# Patient Record
Sex: Female | Born: 1985 | Hispanic: No | Marital: Married | Smoking: Never smoker
Health system: Southern US, Community
[De-identification: ages and names within clinical notes are randomized; demographics above are authoritative.]

## PROBLEM LIST (undated history)

## (undated) ENCOUNTER — Inpatient Hospital Stay (HOSPITAL_COMMUNITY): Payer: Self-pay

## (undated) DIAGNOSIS — Z789 Other specified health status: Secondary | ICD-10-CM

## (undated) DIAGNOSIS — K8020 Calculus of gallbladder without cholecystitis without obstruction: Secondary | ICD-10-CM

## (undated) HISTORY — DX: Calculus of gallbladder without cholecystitis without obstruction: K80.20

---

## 2012-03-10 ENCOUNTER — Other Ambulatory Visit (HOSPITAL_COMMUNITY): Payer: Self-pay | Admitting: Physician Assistant

## 2012-03-10 DIAGNOSIS — Z3689 Encounter for other specified antenatal screening: Secondary | ICD-10-CM

## 2012-03-10 LAB — OB RESULTS CONSOLE HEPATITIS B SURFACE ANTIGEN: Hepatitis B Surface Ag: NEGATIVE

## 2012-03-10 LAB — OB RESULTS CONSOLE RUBELLA ANTIBODY, IGM: Rubella: IMMUNE

## 2012-03-10 LAB — CYSTIC FIBROSIS DIAGNOSTIC STUDY: Interpretation-CFDNA:: NEGATIVE

## 2012-03-10 LAB — OB RESULTS CONSOLE ANTIBODY SCREEN: Antibody Screen: NEGATIVE

## 2012-03-10 LAB — OB RESULTS CONSOLE GC/CHLAMYDIA: Gonorrhea: NEGATIVE

## 2012-03-10 LAB — OB RESULTS CONSOLE ABO/RH

## 2012-03-10 LAB — GLUCOSE TOLERANCE, 1 HOUR (50G) W/O FASTING: Glucose, 1 Hour GTT: 111

## 2012-03-14 ENCOUNTER — Ambulatory Visit (HOSPITAL_COMMUNITY)
Admission: RE | Admit: 2012-03-14 | Discharge: 2012-03-14 | Disposition: A | Discharge status: Home / Self Care | Payer: Medicaid Other | Source: Ambulatory Visit | Attending: Physician Assistant | Admitting: Physician Assistant

## 2012-03-14 ENCOUNTER — Encounter (HOSPITAL_COMMUNITY): Payer: Self-pay

## 2012-03-14 DIAGNOSIS — Z1389 Encounter for screening for other disorder: Secondary | ICD-10-CM | POA: Insufficient documentation

## 2012-03-14 DIAGNOSIS — Z363 Encounter for antenatal screening for malformations: Secondary | ICD-10-CM | POA: Insufficient documentation

## 2012-03-14 DIAGNOSIS — Z3689 Encounter for other specified antenatal screening: Secondary | ICD-10-CM

## 2012-03-14 DIAGNOSIS — O358XX0 Maternal care for other (suspected) fetal abnormality and damage, not applicable or unspecified: Secondary | ICD-10-CM | POA: Insufficient documentation

## 2012-04-04 ENCOUNTER — Other Ambulatory Visit (HOSPITAL_COMMUNITY): Payer: Self-pay | Admitting: Nurse Practitioner

## 2012-04-04 DIAGNOSIS — Z0489 Encounter for examination and observation for other specified reasons: Secondary | ICD-10-CM

## 2012-04-10 ENCOUNTER — Ambulatory Visit (HOSPITAL_COMMUNITY): Payer: Medicaid Other

## 2012-04-11 ENCOUNTER — Ambulatory Visit (HOSPITAL_COMMUNITY)
Admission: RE | Admit: 2012-04-11 | Discharge: 2012-04-11 | Disposition: A | Discharge status: Home / Self Care | Payer: Medicaid Other | Source: Ambulatory Visit | Attending: Nurse Practitioner | Admitting: Nurse Practitioner

## 2012-04-11 DIAGNOSIS — Z1389 Encounter for screening for other disorder: Secondary | ICD-10-CM | POA: Insufficient documentation

## 2012-04-11 DIAGNOSIS — O358XX0 Maternal care for other (suspected) fetal abnormality and damage, not applicable or unspecified: Secondary | ICD-10-CM | POA: Insufficient documentation

## 2012-04-11 DIAGNOSIS — Z363 Encounter for antenatal screening for malformations: Secondary | ICD-10-CM | POA: Insufficient documentation

## 2012-04-11 DIAGNOSIS — Z0489 Encounter for examination and observation for other specified reasons: Secondary | ICD-10-CM

## 2012-05-23 LAB — OB RESULTS CONSOLE RPR: RPR: NONREACTIVE

## 2012-05-23 LAB — OB RESULTS CONSOLE HGB/HCT, BLOOD: Hemoglobin: 12 g/dL

## 2012-05-23 LAB — GLUCOSE TOLERANCE, 1 HOUR (50G) W/O FASTING: Glucose, 1 Hour GTT: 117

## 2012-06-08 ENCOUNTER — Inpatient Hospital Stay (HOSPITAL_COMMUNITY): Payer: Medicaid Other

## 2012-06-08 ENCOUNTER — Inpatient Hospital Stay (HOSPITAL_COMMUNITY)
Admission: AD | Admit: 2012-06-08 | Discharge: 2012-06-09 | Disposition: A | Discharge status: Home / Self Care | Payer: Medicaid Other | Source: Ambulatory Visit | Attending: Obstetrics & Gynecology | Admitting: Obstetrics & Gynecology

## 2012-06-08 ENCOUNTER — Encounter (HOSPITAL_COMMUNITY): Payer: Self-pay | Admitting: *Deleted

## 2012-06-08 DIAGNOSIS — R079 Chest pain, unspecified: Secondary | ICD-10-CM | POA: Insufficient documentation

## 2012-06-08 DIAGNOSIS — R1013 Epigastric pain: Secondary | ICD-10-CM | POA: Insufficient documentation

## 2012-06-08 DIAGNOSIS — K219 Gastro-esophageal reflux disease without esophagitis: Secondary | ICD-10-CM | POA: Insufficient documentation

## 2012-06-08 DIAGNOSIS — O99891 Other specified diseases and conditions complicating pregnancy: Secondary | ICD-10-CM | POA: Insufficient documentation

## 2012-06-08 HISTORY — DX: Other specified health status: Z78.9

## 2012-06-08 LAB — CARDIAC PANEL(CRET KIN+CKTOT+MB+TROPI)
CK, MB: 2 ng/mL (ref 0.3–4.0)
Relative Index: INVALID (ref 0.0–2.5)
Total CK: 37 U/L (ref 7–177)
Troponin I: <0.3 ng/mL (ref <0.30)

## 2012-06-08 LAB — CBC
HCT: 37.2 % (ref 36.0–46.0)
Hemoglobin: 12.5 g/dL (ref 12.0–15.0)
MCH: 28.3 pg (ref 26.0–34.0)
RBC: 4.41 MIL/uL (ref 3.87–5.11)

## 2012-06-08 LAB — COMPREHENSIVE METABOLIC PANEL
ALT: 80 U/L — ABNORMAL HIGH (ref 0–35)
AST: 76 U/L — ABNORMAL HIGH (ref 0–37)
CO2: 26 mEq/L (ref 19–32)
Calcium: 9.9 mg/dL (ref 8.4–10.5)
Creatinine, Ser: 0.42 mg/dL — ABNORMAL LOW (ref 0.50–1.10)
GFR calc non Af Amer: >90 mL/min (ref >90)
Sodium: 137 mEq/L (ref 135–145)
Total Protein: 7 g/dL (ref 6.0–8.3)

## 2012-06-08 LAB — URINALYSIS, ROUTINE W REFLEX MICROSCOPIC
Glucose, UA: NEGATIVE mg/dL
Leukocytes, UA: NEGATIVE
Protein, ur: NEGATIVE mg/dL
Specific Gravity, Urine: 1.025 (ref 1.005–1.030)
Urobilinogen, UA: 1 mg/dL (ref 0.0–1.0)

## 2012-06-08 MED ORDER — ASPIRIN 325 MG PO TABS: 325.0000 mg | ORAL_TABLET | Freq: Every day | ORAL | Status: DC

## 2012-06-08 MED ORDER — ASPIRIN 81 MG PO CHEW
324.0000 mg | CHEWABLE_TABLET | Freq: Once | ORAL | Status: AC
  Administered 2012-06-08: 324 mg via ORAL
  Filled 2012-06-08: qty 4

## 2012-06-08 MED ORDER — MORPHINE SULFATE 4 MG/ML IJ SOLN: 4.0000 mg | INTRAMUSCULAR | Status: DC | PRN

## 2012-06-08 MED ORDER — LACTATED RINGERS IV SOLN: INTRAVENOUS | Status: DC

## 2012-06-08 MED ORDER — LACTATED RINGERS IV BOLUS (SEPSIS)
500.0000 mL | Freq: Once | INTRAVENOUS | Status: AC
  Administered 2012-06-08: 500 mL via INTRAVENOUS

## 2012-06-08 MED ORDER — GI COCKTAIL ~~LOC~~
30.0000 mL | Freq: Once | ORAL | Status: AC
  Administered 2012-06-08: 30 mL via ORAL
  Filled 2012-06-08: qty 30

## 2012-06-08 MED ORDER — PANTOPRAZOLE SODIUM 40 MG IV SOLR
40.0000 mg | Freq: Once | INTRAVENOUS | Status: AC
  Administered 2012-06-08: 40 mg via INTRAVENOUS
  Filled 2012-06-08: qty 40

## 2012-06-08 NOTE — MAU Provider Note (Signed)
History    CSN: [id number] Arrival date and time: 06/08/12 2030  None  No chief complaint on file.  HPI Comments: Pt is a 25yo [place] [id number] @ [redacted]w[redacted]d who presents with CC of acute onset chest pain that is mid sternal radiating to midepigastrum.  Does not radiate down arm, does not radiate to neck or back.  Acute onset ~1 hour prior to evaluation in MAU.  Was transported via private vehicle.  Pain worse with exertion.  Worse with laying flat or with leaning forward; best when laying upright ~70o.  No diaphoresis.  Pain 10/10.    Baby is moving normally.  No bleeding, no discharge, no leaking of fluid.  No contractions noted.  Primary C-section for last delivery 2o/2 arrest of descent at fully dilated.  1st pregnancy with IUFD at 7 months per patient.    No prior history of GERD, last meal ~4 hours prior to onset.  Pt was cooking at the time of onset.   No LE edema, no LE pain, non-pleuritic pain   OB History    Grav Para Term Preterm Abortions TAB SAB Ect Mult Living   3 2 1 1      1       Past Medical History  Diagnosis Date  . No pertinent past medical history     Past Surgical History  Procedure Date  . Cesarean section     Family History  Problem Relation Age of Onset  . Other Neg Hx     History  Substance Use Topics  . Smoking status: Never Smoker   . Smokeless tobacco: Not on file  . Alcohol Use: No    Allergies: No Known Allergies  Prescriptions prior to admission  Medication Sig Dispense Refill  . Prenatal Vit-Fe Fumarate-FA (PRENATAL MULTIVITAMIN) TABS Take 1 tablet by mouth daily.        Review of Systems  Constitutional: Negative for fever and chills.  HENT: Negative for congestion.   Eyes: Negative for blurred vision and double vision.  Respiratory: Negative for cough and wheezing.   Cardiovascular: Positive for chest pain. Negative for palpitations, orthopnea, claudication and leg swelling.  Gastrointestinal: Negative for heartburn, nausea,  vomiting (earlier in pregnancy but not since 1st trimester), abdominal pain, diarrhea, constipation, blood in stool and melena.  Genitourinary: Negative for dysuria, urgency, frequency and hematuria.  Musculoskeletal: Negative.   Skin: Negative.   Neurological: Negative.  Negative for dizziness and headaches.  Endo/Heme/Allergies: Negative.   Psychiatric/Behavioral: Negative.    Physical Exam   Blood pressure 117/85, pulse 90, temperature 97.5 F (36.4 C), temperature source Oral, resp. rate 26, SpO2 100.00%.  Physical Exam  Nursing note and vitals reviewed. Constitutional: She appears well-developed and well-nourished. She appears distressed (severely uncomfortable).  HENT:  Head: Normocephalic and atraumatic.  Mouth/Throat: No oropharyngeal exudate.  Eyes: Conjunctivae are normal. Right eye exhibits no discharge. Left eye exhibits no discharge. No scleral icterus.  Neck: No JVD present.  Cardiovascular: Normal rate, regular rhythm and intact distal pulses.  Exam reveals no gallop and no friction rub.   Murmur (II/VI systolic flow murmur heard best at 2nd L sternal boarder) heard. Respiratory: Effort normal and breath sounds normal. No respiratory distress. She has no wheezes. She has no rales. She exhibits tenderness (worsening pain with midsternal palpation but constant pain at baseline).  GI: Soft. Bowel sounds are normal. She exhibits distension and mass. There is no tenderness. There is no rebound and no guarding.  Genitourinary: Vagina normal.  Dilation: Closed Effacement (%): Thick Exam by:: Legg   Musculoskeletal: She exhibits no edema and no tenderness.  Neurological: She is alert. She exhibits normal muscle tone.  Skin: Skin is warm and dry. No rash noted. She is not diaphoretic. No erythema. No pallor.  Psychiatric: She has a normal mood and affect. Her behavior is normal. Judgment and thought content normal.    MAU Course  Procedures Results for orders placed  during the hospital encounter of 06/08/12 (from the past 24 hour(s))  CARDIAC PANEL(CRET KIN+CKTOT+MB+TROPI)     Status: Normal   Collection Time   06/08/12  9:37 PM      Component Value Range   Total CK 37  7 - 177 U/L   CK, MB 2.0  0.3 - 4.0 ng/mL   Troponin I <0.30  <0.30 ng/mL   Relative Index RELATIVE INDEX IS INVALID  0.0 - 2.5  COMPREHENSIVE METABOLIC PANEL     Status: Abnormal   Collection Time   06/08/12  9:37 PM      Component Value Range   Sodium 137  135 - 145 mEq/L   Potassium 3.6  3.5 - 5.1 mEq/L   Chloride 100  96 - 112 mEq/L   CO2 26  19 - 32 mEq/L   Glucose, Bld 96  70 - 99 mg/dL   BUN 6  6 - 23 mg/dL   Creatinine, Ser 1.19 (*) 0.50 - 1.10 mg/dL   Calcium 9.9  8.4 - 14.7 mg/dL   Total Protein 7.0  6.0 - 8.3 g/dL   Albumin 3.1 (*) 3.5 - 5.2 g/dL   AST 76 (*) 0 - 37 U/L   ALT 80 (*) 0 - 35 U/L   Alkaline Phosphatase 136 (*) 39 - 117 U/L   Total Bilirubin 0.4  0.3 - 1.2 mg/dL   GFR calc non Af Amer >90  >90 mL/min   GFR calc Af Amer >90  >90 mL/min  CBC     Status: Abnormal   Collection Time   06/08/12  9:37 PM      Component Value Range   WBC 10.7 (*) 4.0 - 10.5 K/uL   RBC 4.41  3.87 - 5.11 MIL/uL   Hemoglobin 12.5  12.0 - 15.0 g/dL   HCT 82.9  56.2 - 13.0 %   MCV 84.4  78.0 - 100.0 fL   MCH 28.3  26.0 - 34.0 pg   MCHC 33.6  30.0 - 36.0 g/dL   RDW 86.5  78.4 - 69.6 %   Platelets 179  150 - 400 K/uL  PROTEIN / CREATININE RATIO, URINE     Status: Abnormal   Collection Time   06/08/12 10:15 PM      Component Value Range   Creatinine, Urine 81.16     Total Protein, Urine 21.2     PROTEIN CREATININE RATIO 0.26 (*) 0.00 - 0.15  URINALYSIS, ROUTINE W REFLEX MICROSCOPIC     Status: Normal   Collection Time   06/08/12 10:15 PM      Component Value Range   Color, Urine YELLOW  YELLOW   APPearance CLEAR  CLEAR   Specific Gravity, Urine 1.025  1.005 - 1.030   pH 6.5  5.0 - 8.0   Glucose, UA NEGATIVE  NEGATIVE mg/dL   Hgb urine dipstick NEGATIVE  NEGATIVE    Bilirubin Urine NEGATIVE  NEGATIVE   Ketones, ur NEGATIVE  NEGATIVE mg/dL   Protein, ur NEGATIVE  NEGATIVE mg/dL   Urobilinogen, UA  1.0  0.0 - 1.0 mg/dL   Nitrite NEGATIVE  NEGATIVE   Leukocytes, UA NEGATIVE  NEGATIVE    MDM EKG with Inverted P&T waves in V1 & V2.  No signs of ST elevation/depression.  No evidence of R heart strain.  S/p ASA 325, Cardiac Enzymes Negative X 1,  Normal physiologic variant in V1&V2 BP elevated to 140/86, likely due to pain.  Will check PIH labs. No evidence of LE DVT and O2 saturations 100%. S/p GI Cocktail - no improvement  Pt had spontaneous improvement ~73minutes following GI cocktail with resolution from 7/10 to 0/10 following a positional change. Transaminitis in setting of normal platelets,  BPs improved with resolution of pain, negative protein on dipstick, neg Pr:Cr ratio.  Consider Biliary Colic, No ultrasound evidence of acute cholecystitis  Given spontaneous resolution of pain cardiac source less likely.    Assessment and Plan  Epigastric Pain; GERD.  Improved with GI cocktail, will tx with PPI daily Transaminitis, no evidence of acute cholecystitis, consider passed stone as etiology  Pt evaluated by Dr. Penne Lash in MAU.  Andrena Mews, DO [facility] Resident - PGY-1 06/09/2012 12:49 AM

## 2012-06-08 NOTE — MAU Note (Signed)
Pt reports chest sudden chest pain at 2030

## 2012-06-09 LAB — PROTEIN / CREATININE RATIO, URINE
Creatinine, Urine: 81.16 mg/dL
Total Protein, Urine: 21.2 mg/dL

## 2012-06-09 LAB — LIPASE, BLOOD: Lipase: 42 U/L (ref 11–59)

## 2012-06-09 MED ORDER — OMEPRAZOLE 40 MG PO CPDR: 40.0000 mg | DELAYED_RELEASE_CAPSULE | Freq: Every day | ORAL | Status: DC

## 2012-06-09 NOTE — Discharge Instructions (Signed)
Heartburn During Pregnancy  Heartburn is a burning sensation in the chest caused by stomach acid backing up into the esophagus. Heartburn (also known as "reflux") is common in pregnancy because a certain hormone (progesterone) changes. The progesterone hormone may relax the valve that separates the esophagus from the stomach. This allows acid to go up into the esophagus, causing heartburn. Heartburn may also happen in pregnancy because the enlarging uterus pushes up on the stomach, which pushes more acid into the esophagus. This is especially true in the later stages of pregnancy. Heartburn problems usually go away after giving birth. CAUSES   The progesterone hormone.   Changing hormone levels.   The growing uterus that pushes stomach acid upward.   Large meals.   Certain foods and drinks.   Exercise.   Increased acid production.  SYMPTOMS   Burning pain in the chest or lower throat.   Bitter taste in the mouth.   Coughing.  DIAGNOSIS  Heartburn is typically diagnosed by your caregiver when taking a careful history of your concern. Your caregiver may order a blood test to check for a certain type of bacteria that is associated with heartburn. Sometimes, heartburn is diagnosed by prescribing a heartburn medicine to see if the symptoms improve. It is rare in pregnancy to have a procedure called an endoscopy. This is when a tube with a light and a camera on the end is used to examine the esophagus and the stomach. TREATMENT   Your caregiver may tell you to use certain over-the-counter medicines (antacids, acid reducers) for mild heartburn.   Your caregiver may prescribe medicines to decrease stomach acid or to protect your stomach lining.   Your caregiver may recommend certain diet changes.   For severe cases, your caregiver may recommend that the head of the bed be elevated on blocks. (Sleeping with more pillows is not an effective treatment as it only changes the position of your  head and does not improve the main problem of stomach acid refluxing into the esophagus.)  HOME CARE INSTRUCTIONS   Take all medicines as directed by your caregiver.   Raise the head of your bed by putting blocks under the legs if instructed to by your caregiver.   Do not exercise right after eating.   Avoid eating 2 or 3 hours before bed. Do not lie down right after eating.   Eat small meals throughout the day instead of 3 large meals.   Identify foods and beverages that make your symptoms worse and avoid them. Foods you may want to avoid include:   Peppers.   Chocolate.   High-fat foods, including fried foods.   Spicy foods.   Garlic and onions.   Citrus fruits, including oranges, grapefruit, lemons, and limes.   Food containing tomatoes or tomato products.   Mint.   Carbonated and caffeinated drinks.   Vinegar.  SEEK IMMEDIATE MEDICAL CARE IF:   You have severe chest pain that goes down your arm or into your jaw or neck.   You feel sweaty, dizzy, or lightheaded.   You become short of breath.   You vomit blood.   You have difficulty or pain with swallowing.   You have bloody or black, tarry stools.   You have episodes of heartburn more than 3 times a week, for more than 2 weeks.  MAKE SURE YOU:  Understand these instructions.   Will watch your condition.   Will get help right away if you are not doing well or   get worse.  Document Released: 11/30/2000 Document Revised: 11/22/2011 Document Reviewed: 05/24/2011 [employer] Patient Information 2012 [employer]. 

## 2012-06-13 ENCOUNTER — Encounter (HOSPITAL_COMMUNITY): Payer: Self-pay | Admitting: *Deleted

## 2012-06-13 ENCOUNTER — Other Ambulatory Visit: Payer: Self-pay | Admitting: Obstetrics & Gynecology

## 2012-06-13 ENCOUNTER — Observation Stay (HOSPITAL_COMMUNITY)
Admission: AD | Admit: 2012-06-13 | Discharge: 2012-06-15 | DRG: 781 | Disposition: A | Discharge status: Home / Self Care | Payer: Medicaid Other | Source: Ambulatory Visit | Attending: Obstetrics & Gynecology | Admitting: Obstetrics & Gynecology

## 2012-06-13 DIAGNOSIS — K219 Gastro-esophageal reflux disease without esophagitis: Secondary | ICD-10-CM | POA: Diagnosis present

## 2012-06-13 DIAGNOSIS — O26613 Liver and biliary tract disorders in pregnancy, third trimester: Secondary | ICD-10-CM

## 2012-06-13 DIAGNOSIS — R7989 Other specified abnormal findings of blood chemistry: Secondary | ICD-10-CM | POA: Diagnosis present

## 2012-06-13 DIAGNOSIS — R748 Abnormal levels of other serum enzymes: Secondary | ICD-10-CM

## 2012-06-13 DIAGNOSIS — O9989 Other specified diseases and conditions complicating pregnancy, childbirth and the puerperium: Secondary | ICD-10-CM

## 2012-06-13 DIAGNOSIS — O34219 Maternal care for unspecified type scar from previous cesarean delivery: Secondary | ICD-10-CM | POA: Diagnosis present

## 2012-06-13 DIAGNOSIS — O10019 Pre-existing essential hypertension complicating pregnancy, unspecified trimester: Secondary | ICD-10-CM

## 2012-06-13 DIAGNOSIS — O99891 Other specified diseases and conditions complicating pregnancy: Principal | ICD-10-CM | POA: Diagnosis present

## 2012-06-13 DIAGNOSIS — O09299 Supervision of pregnancy with other poor reproductive or obstetric history, unspecified trimester: Secondary | ICD-10-CM

## 2012-06-13 LAB — CBC
MCHC: 33.1 g/dL (ref 30.0–36.0)
Platelets: 193 10*3/uL (ref 150–400)
RDW: 14.7 % (ref 11.5–15.5)
WBC: 10 10*3/uL (ref 4.0–10.5)

## 2012-06-13 LAB — COMPREHENSIVE METABOLIC PANEL
BUN: 5 mg/dL — ABNORMAL LOW (ref 6–23)
CO2: 26 mEq/L (ref 19–32)
Calcium: 9.3 mg/dL (ref 8.4–10.5)
Chloride: 101 mEq/L (ref 96–112)
Creatinine, Ser: 0.44 mg/dL — ABNORMAL LOW (ref 0.50–1.10)
GFR calc Af Amer: >90 mL/min (ref >90)
GFR calc non Af Amer: >90 mL/min (ref >90)
Glucose, Bld: 83 mg/dL (ref 70–99)
Total Bilirubin: 0.2 mg/dL — ABNORMAL LOW (ref 0.3–1.2)

## 2012-06-13 LAB — PROTEIN / CREATININE RATIO, URINE: Total Protein, Urine: <4 mg/dL

## 2012-06-13 MED ORDER — PANTOPRAZOLE SODIUM 40 MG PO TBEC
80.0000 mg | DELAYED_RELEASE_TABLET | Freq: Every day | ORAL | Status: DC
  Administered 2012-06-13 – 2012-06-14 (×2): 80 mg via ORAL
  Filled 2012-06-13 (×4): qty 2

## 2012-06-13 MED ORDER — PRENATAL MULTIVITAMIN CH
1.0000 | ORAL_TABLET | Freq: Every day | ORAL | Status: DC
  Administered 2012-06-13 – 2012-06-14 (×2): 1 via ORAL
  Filled 2012-06-13 (×2): qty 1

## 2012-06-13 MED ORDER — ZOLPIDEM TARTRATE 5 MG PO TABS: 5.0000 mg | ORAL_TABLET | Freq: Every evening | ORAL | Status: DC | PRN

## 2012-06-13 MED ORDER — PRENATAL MULTIVITAMIN CH: 1.0000 | ORAL_TABLET | Freq: Every day | ORAL | Status: DC

## 2012-06-13 MED ORDER — CALCIUM CARBONATE ANTACID 500 MG PO CHEW: 2.0000 | CHEWABLE_TABLET | ORAL | Status: DC | PRN

## 2012-06-13 MED ORDER — DOCUSATE SODIUM 100 MG PO CAPS
100.0000 mg | ORAL_CAPSULE | Freq: Every day | ORAL | Status: DC
  Administered 2012-06-14: 100 mg via ORAL
  Filled 2012-06-13: qty 1

## 2012-06-13 MED ORDER — ACETAMINOPHEN 325 MG PO TABS: 650.0000 mg | ORAL_TABLET | ORAL | Status: DC | PRN

## 2012-06-13 NOTE — MAU Note (Signed)
Patient states she received a call telling her blood work done on 6-23 was abnormal and to return to MAU for evaluation. Patient denies any pain, bleeding or leaking and reports good fetal movement.

## 2012-06-13 NOTE — H&P (Signed)
Kayla Mclean is a  25 y.o. G3P1101 at [redacted]w[redacted]d who presents for reevaluation of hypertension labs. Patient was seen in the MAU on 06/08/12 for epigastric pain. Patient was worked up for acute coronary syndrome which was found to be negative (no ST changes on EKG, CE x 1 neg). Patient was given GI cocktail which completely resolved symptoms and she was discharged home on Prilosec OTC daily which has continued to work effectively. Urinary protein/creatinine ratio lab work was resulted soon after visit revealing a ratio of 0.26. Patient also had increased BP (140/86) and increased AST/ALT (76 / 80) and thus she was asked to return today for further workup and evaluation.   Patient denies any complaints at this time and simply returns to MAU per phone call from health dept. asking patient to present to MAU.  Patient states no contractions associated with no vaginal bleeding or discharge, membranes are intact, along with active fetal movements.  Obstetrical/Gynecological History: OB History    Grav Para Term Preterm Abortions TAB SAB Ect Mult Living   3 2 1 1 0  0   1      Past Medical History: Past Medical History  Diagnosis Date  . No pertinent past medical history     Past Surgical History: Past Surgical History  Procedure Date  . Cesarean section     Family History: Family History  Problem Relation Age of Onset  . Other Neg Hx     Social History: History  Substance Use Topics  . Smoking status: Never Smoker   . Smokeless tobacco: Never Used  . Alcohol Use: No    Allergies: No Known Allergies  Prescriptions prior to admission  Medication Sig Dispense Refill  . omeprazole (PRILOSEC) 40 MG capsule Take 1 capsule (40 mg total) by mouth daily.  30 capsule  0  . Prenatal Vit-Fe Fumarate-FA (PRENATAL MULTIVITAMIN) TABS Take 1 tablet by mouth daily.        Review of Systems - General ROS: negative for - chills, fever or night sweats Psychological ROS: negative for - anxiety or  depression Ophthalmic ROS: negative ENT ROS: negative for - headaches, hearing change or visual changes Allergy and Immunology ROS: negative Hematological and Lymphatic ROS: negative for - bleeding problems or night sweats Endocrine ROS: negative for - malaise/lethargy Respiratory ROS: no cough, shortness of breath, or wheezing Cardiovascular ROS: no chest pain or dyspnea on exertion Gastrointestinal ROS: no abdominal pain, change in bowel habits, or black or bloody stools Genito-Urinary ROS: no dysuria, trouble voiding, or hematuria Musculoskeletal ROS: negative Neurological ROS: no TIA or stroke symptoms Dermatological ROS: negative  Physical Exam   Blood pressure 111/76, pulse 94, temperature 98.3 F (36.8 C), temperature source Oral, resp. rate 16, height 4' 9.5" (1.461 m), weight 64.048 kg (141 lb 3.2 oz), SpO2 100.00%.  General: General appearance - alert, well appearing, and in no distress Mental status - alert, oriented to person, place, and time Eyes - pupils equal and reactive, extraocular eye movements intact; no yellowing of eyes Mouth - mucous membranes moist, pharynx normal without lesions Neck - supple, no significant adenopathy Chest - clear to auscultation, no wheezes, rales or rhonchi, symmetric air entry Heart - normal rate, regular rhythm, normal S1, S2, no murmurs, rubs, clicks or gallops Abdomen - soft, nontender, nondistended, no masses or organomegaly Back exam - full range of motion, no tenderness, palpable spasm or pain on motion Neurological - alert, oriented, normal speech, no focal findings or movement disorder noted   Musculoskeletal - no joint tenderness, deformity or swelling Extremities - pedal edema 2 + RLE, 1+ LLE Skin - normal coloration and turgor, no rashes, no suspicious skin lesions noted Baseline: 140 bpm, Variability: Good {> 6 bpm), Accelerations: Reactive and Decelerations: Absent Contractions: None   Labs: Recent Results (from the past  24 hour(s))  CBC   Collection Time   06/13/12  3:55 PM      Component Value Range   WBC 10.0  4.0 - 10.5 K/uL   RBC 4.32  3.87 - 5.11 MIL/uL   Hemoglobin 12.1  12.0 - 15.0 g/dL   HCT 36.6  36.0 - 46.0 %   MCV 84.7  78.0 - 100.0 fL   MCH 28.0  26.0 - 34.0 pg   MCHC 33.1  30.0 - 36.0 g/dL   RDW 14.7  11.5 - 15.5 %   Platelets 193  150 - 400 K/uL  COMPREHENSIVE METABOLIC PANEL   Collection Time   06/13/12  3:55 PM      Component Value Range   Sodium 136  135 - 145 mEq/L   Potassium 3.5  3.5 - 5.1 mEq/L   Chloride 101  96 - 112 mEq/L   CO2 26  19 - 32 mEq/L   Glucose, Bld 83  70 - 99 mg/dL   BUN 5 (*) 6 - 23 mg/dL   Creatinine, Ser 0.44 (*) 0.50 - 1.10 mg/dL   Calcium 9.3  8.4 - 10.5 mg/dL   Total Protein 7.1  6.0 - 8.3 g/dL   Albumin 3.0 (*) 3.5 - 5.2 g/dL   AST 129 (*) 0 - 37 U/L   ALT 107 (*) 0 - 35 U/L   Alkaline Phosphatase 146 (*) 39 - 117 U/L   Total Bilirubin 0.2 (*) 0.3 - 1.2 mg/dL   GFR calc non Af Amer >90  >90 mL/min   GFR calc Af Amer >90  >90 mL/min   Imaging Studies:  Us Abdomen Limited Ruq  06/08/2012  *RADIOLOGY REPORT*  Clinical Data:  Epigastric pain.  Elevated liver function tests.  LIMITED ABDOMINAL ULTRASOUND - RIGHT UPPER QUADRANT  Comparison:  None.  Findings:  Gallbladder:  The gallbladder is not abnormally distended.  No gallbladder wall thickening, pericholecystic edema, stones, or sludge.  Common bile duct:  Normal caliber with measured diameter of 4 mm.  Liver:  Normal homogeneous parenchymal echotexture.  No focal mass lesions.  IMPRESSION: Normal ultrasound appearance of the liver, gallbladder, and common bile duct.                   Original Report Authenticated By: WILLIAM R. STEVENS, M.D.     Assessment: Kayla Mclean is  25 y.o. G3P1101 at [redacted]w[redacted]d presents for re-evaluation of hypertension labs. 1) Increased transaminases (AST, 129 / ALT,107) 2) BP improved today (111 / 76) 3) GERD 2/2 pregnancy   Plan: Admit to antenatal Hepatitis  Panel 24 hour urine collection Continue Prilosec for GERD  MUHAMMAD,Rayhana Slider  

## 2012-06-13 NOTE — Progress Notes (Signed)
[place] Kestner is a  26 y.o. G3P1101 at [redacted]w[redacted]d who presents for reevaluation of hypertension labs. Patient was seen in the MAU on 06/08/12 for epigastric pain. Patient was worked up for acute coronary syndrome which was found to be negative (no ST changes on EKG, CE x 1 neg). Patient was given GI cocktail which completely resolved symptoms and she was discharged home on Prilosec OTC daily which has continued to work effectively. Urinary protein/creatinine ratio lab work was resulted soon after visit revealing a ratio of 0.26. Patient also had increased BP (140/86) and increased AST/ALT (76 / 80) and thus she was asked to return today for further workup and evaluation.   Patient denies any complaints at this time and simply returns to MAU per phone call from health dept. asking patient to present to MAU.  Patient states no contractions associated with no vaginal bleeding or discharge, membranes are intact, along with active fetal movements.  Obstetrical/Gynecological History: OB History    Grav Para Term Preterm Abortions TAB SAB Ect Mult Living   3 2 1 1  0  0   1      Past Medical History: Past Medical History  Diagnosis Date  . No pertinent past medical history     Past Surgical History: Past Surgical History  Procedure Date  . Cesarean section     Family History: Family History  Problem Relation Age of Onset  . Other Neg Hx     Social History: History  Substance Use Topics  . Smoking status: Never Smoker   . Smokeless tobacco: Never Used  . Alcohol Use: No    Allergies: No Known Allergies  Prescriptions prior to admission  Medication Sig Dispense Refill  . omeprazole (PRILOSEC) 40 MG capsule Take 1 capsule (40 mg total) by mouth daily.  30 capsule  0  . Prenatal Vit-Fe Fumarate-FA (PRENATAL MULTIVITAMIN) TABS Take 1 tablet by mouth daily.        Review of Systems - General ROS: negative for - chills, fever or night sweats Psychological ROS: negative for - anxiety or  depression Ophthalmic ROS: negative ENT ROS: negative for - headaches, hearing change or visual changes Allergy and Immunology ROS: negative Hematological and Lymphatic ROS: negative for - bleeding problems or night sweats Endocrine ROS: negative for - malaise/lethargy Respiratory ROS: no cough, shortness of breath, or wheezing Cardiovascular ROS: no chest pain or dyspnea on exertion Gastrointestinal ROS: no abdominal pain, change in bowel habits, or black or bloody stools Genito-Urinary ROS: no dysuria, trouble voiding, or hematuria Musculoskeletal ROS: negative Neurological ROS: no TIA or stroke symptoms Dermatological ROS: negative  Physical Exam   Blood pressure 111/76, pulse 94, temperature 98.3 F (36.8 C), temperature source Oral, resp. rate 16, height 4' 9.5" (1.461 m), weight 64.048 kg (141 lb 3.2 oz), SpO2 100.00%.  General: General appearance - alert, well appearing, and in no distress Mental status - alert, oriented to person, place, and time Eyes - pupils equal and reactive, extraocular eye movements intact; no yellowing of eyes Mouth - mucous membranes moist, pharynx normal without lesions Neck - supple, no significant adenopathy Chest - clear to auscultation, no wheezes, rales or rhonchi, symmetric air entry Heart - normal rate, regular rhythm, normal S1, S2, no murmurs, rubs, clicks or gallops Abdomen - soft, nontender, nondistended, no masses or organomegaly Back exam - full range of motion, no tenderness, palpable spasm or pain on motion Neurological - alert, oriented, normal speech, no focal findings or movement disorder noted  Musculoskeletal - no joint tenderness, deformity or swelling Extremities - pedal edema 2 + RLE, 1+ LLE Skin - normal coloration and turgor, no rashes, no suspicious skin lesions noted Baseline: 140 bpm, Variability: Good {> 6 bpm), Accelerations: Reactive and Decelerations: Absent Contractions: None   Labs: Recent Results (from the past  24 hour(s))  CBC   Collection Time   06/13/12  3:55 PM      Component Value Range   WBC 10.0  4.0 - 10.5 K/uL   RBC 4.32  3.87 - 5.11 MIL/uL   Hemoglobin 12.1  12.0 - 15.0 g/dL   HCT 13.0  86.5 - 78.4 %   MCV 84.7  78.0 - 100.0 fL   MCH 28.0  26.0 - 34.0 pg   MCHC 33.1  30.0 - 36.0 g/dL   RDW 69.6  29.5 - 28.4 %   Platelets 193  150 - 400 K/uL  COMPREHENSIVE METABOLIC PANEL   Collection Time   06/13/12  3:55 PM      Component Value Range   Sodium 136  135 - 145 mEq/L   Potassium 3.5  3.5 - 5.1 mEq/L   Chloride 101  96 - 112 mEq/L   CO2 26  19 - 32 mEq/L   Glucose, Bld 83  70 - 99 mg/dL   BUN 5 (*) 6 - 23 mg/dL   Creatinine, Ser 1.32 (*) 0.50 - 1.10 mg/dL   Calcium 9.3  8.4 - 44.0 mg/dL   Total Protein 7.1  6.0 - 8.3 g/dL   Albumin 3.0 (*) 3.5 - 5.2 g/dL   AST 102 (*) 0 - 37 U/L   ALT 107 (*) 0 - 35 U/L   Alkaline Phosphatase 146 (*) 39 - 117 U/L   Total Bilirubin 0.2 (*) 0.3 - 1.2 mg/dL   GFR calc non Af Amer >90  >90 mL/min   GFR calc Af Amer >90  >90 mL/min   Imaging Studies:  US Abdomen Limited Ruq  06/08/2012  *RADIOLOGY REPORT*  Clinical Data:  Epigastric pain.  Elevated liver function tests.  LIMITED ABDOMINAL ULTRASOUND - RIGHT UPPER QUADRANT  Comparison:  None.  Findings:  Gallbladder:  The gallbladder is not abnormally distended.  No gallbladder wall thickening, pericholecystic edema, stones, or sludge.  Common bile duct:  Normal caliber with measured diameter of 4 mm.  Liver:  Normal homogeneous parenchymal echotexture.  No focal mass lesions.  IMPRESSION: Normal ultrasound appearance of the liver, gallbladder, and common bile duct.                   Original Report Authenticated By: Marlon Pel, M.D.     Assessment: Katyra Tomassetti is  26 y.o. G3P1101 at [redacted]w[redacted]d presents for re-evaluation of hypertension labs. 1) Increased transaminases (AST, 129 / ALT,107) 2) BP improved today (111 / 76) 3) GERD 2/2 pregnancy   Plan: Admit to antenatal Hepatitis  Panel 24 hour urine collection Continue Prilosec for GERD  [facility]

## 2012-06-13 NOTE — Progress Notes (Signed)
Pt has elevated liver enzymes when she was seen in MAU earlier this week.  Chest pain resolved with GI cocktail.  After discharge, the protein / creatinine ratio returned elevated at .26.  Will have pt come back in for rpt labs, BP check, rpt prot / creat ration and 24 hour urine.  Nicholes Calamity from health dept is calling patient.

## 2012-06-14 ENCOUNTER — Inpatient Hospital Stay (HOSPITAL_COMMUNITY): Payer: Medicaid Other

## 2012-06-14 LAB — CREATININE CLEARANCE, URINE, 24 HOUR
Creatinine, Urine: 31.61 mg/dL
Creatinine: 0.44 mg/dL — ABNORMAL LOW (ref 0.50–1.10)

## 2012-06-14 LAB — PROTEIN, URINE, 24 HOUR
Collection Interval-UPROT: 24 hours
Protein, 24H Urine: 123 mg/d — ABNORMAL HIGH (ref 50–100)
Urine Total Volume-UPROT: 2450 mL

## 2012-06-14 LAB — TYPE AND SCREEN

## 2012-06-14 NOTE — Progress Notes (Signed)
FACULTY PRACTICE ANTEPARTUM(COMPREHENSIVE) NOTE  Kayla Mclean is a 26 y.o. G3P1101 at [redacted]w[redacted]d by early ultrasound who is admitted for abnormal liver function tests. And borderline Pr/Cr ration of 0.26 to complete 24 hr urine collection test and obtain hepatitis panel.  Communication thought to be limited but pt speaks adequate English   Fetal presentation is unsure. Length of Stay:  1  Days  Subjective: Pt denies headache or abd pain. She had abd discomfort 1 week ago, resolved Patient reports the fetal movement as active. Patient reports uterine contraction  activity as none. Patient reports  vaginal bleeding as none. Patient describes fluid per vagina as None.  Vitals:  Blood pressure 101/73, pulse 87, temperature 97.7 F (36.5 C), temperature source Oral, resp. rate 18, height 4' 9.5" (1.461 m), weight 64.048 kg (141 lb 3.2 oz), SpO2 100.00%. Physical Examination:  General appearance - alert, well appearing, and in no distress Heart - normal rate and regular rhythm Abdomen - soft, nontender, nondistended Fundal Height:  size equals dates Cervical Exam: Not evaluated. a. Extremities: extremities normal, atraumatic, no cyanosis or edema and Homans sign is negative, no sign of DVT with DTRs 2+ bilaterally Membranes:intact  Fetal Monitoring:  reassuring  Labs:  Recent Results (from the past 24 hour(s))  CBC   Collection Time   06/13/12  3:55 PM      Component Value Range   WBC 10.0  4.0 - 10.5 K/uL   RBC 4.32  3.87 - 5.11 MIL/uL   Hemoglobin 12.1  12.0 - 15.0 g/dL   HCT 40.9  81.1 - 91.4 %   MCV 84.7  78.0 - 100.0 fL   MCH 28.0  26.0 - 34.0 pg   MCHC 33.1  30.0 - 36.0 g/dL   RDW 78.2  95.6 - 21.3 %   Platelets 193  150 - 400 K/uL  COMPREHENSIVE METABOLIC PANEL   Collection Time   06/13/12  3:55 PM      Component Value Range   Sodium 136  135 - 145 mEq/L   Potassium 3.5  3.5 - 5.1 mEq/L   Chloride 101  96 - 112 mEq/L   CO2 26  19 - 32 mEq/L   Glucose, Bld 83  70 - 99 mg/dL    BUN 5 (*) 6 - 23 mg/dL   Creatinine, Ser 0.86 (*) 0.50 - 1.10 mg/dL   Calcium 9.3  8.4 - 57.8 mg/dL   Total Protein 7.1  6.0 - 8.3 g/dL   Albumin 3.0 (*) 3.5 - 5.2 g/dL   AST 469 (*) 0 - 37 U/L   ALT 107 (*) 0 - 35 U/L   Alkaline Phosphatase 146 (*) 39 - 117 U/L   Total Bilirubin 0.2 (*) 0.3 - 1.2 mg/dL   GFR calc non Af Amer >90  >90 mL/min   GFR calc Af Amer >90  >90 mL/min  PROTEIN / CREATININE RATIO, URINE   Collection Time   06/13/12  3:55 PM      Component Value Range   Creatinine, Urine 20.56     Total Protein, Urine <4     PROTEIN CREATININE RATIO         0.00 - 0.15  TYPE AND SCREEN   Collection Time   06/14/12  3:30 AM      Component Value Range   ABO/RH(D) O POS     Antibody Screen NEG     Sample Expiration 06/17/2012    ABO/RH   Collection Time   06/14/12  3:30 AM      Component Value Range   ABO/RH(D) O POS      Imaging Studies:    Medications:  Scheduled    . docusate sodium  100 mg Oral Daily  . pantoprazole  80 mg Oral Q1200  . prenatal multivitamin  1 tablet Oral Daily  . DISCONTD: prenatal multivitamin  1 tablet Oral Daily   I have reviewed the patient's current medications.  ASSESSMENT: Patient Active Problem List  Diagnosis  . Prior pregnancy with fetal demise  . H/O cesarean section complicating pregnancy   Elevated LFT , and borderline pr/cr ratio, r/o Preeclampsia PLAN: Complete 24 hr TP (7pm)and followup Acute Hepatitis panel.  Probable  D/c later today  Kayla Mclean V 06/14/2012,7:50 AM

## 2012-06-14 NOTE — Progress Notes (Signed)
Jerolyn Center, CNM notified of pt's lab results

## 2012-06-14 NOTE — Progress Notes (Signed)
24 hr urine collected and sent to lab. 

## 2012-06-15 DIAGNOSIS — R748 Abnormal levels of other serum enzymes: Secondary | ICD-10-CM

## 2012-06-15 NOTE — Progress Notes (Signed)
Discharge instructions reviewed with patient, pt. And spouse verbalized understanding, copy given.

## 2012-06-15 NOTE — Discharge Instructions (Signed)
Preeclampsia and Eclampsia  Preeclampsia is a condition of high blood pressure during pregnancy. It can happen at 20 weeks or later in pregnancy. If high blood pressure occurs in the second half of pregnancy with no other symptoms, it is called gestational hypertension and goes away after the baby is born. If any of the symptoms listed below develop with gestational hypertension, it is then called preeclampsia. Eclampsia (convulsions) may follow preeclampsia. This is one of the reasons for regular prenatal checkups. Early diagnosis and treatment are very important to prevent eclampsia.  CAUSES   There is no known cause of preeclampsia/eclampsia in pregnancy. There are several known conditions that may put the pregnant woman at risk, such as:   The first pregnancy.   Having preeclampsia in a past pregnancy.   Having lasting (chronic) high blood pressure.   Having multiples (twins, triplets).   Being age 35 or older.   African American ethnic background.   Having kidney disease or diabetes.   Medical conditions such as lupus or blood diseases.   Being overweight (obese).  SYMPTOMS    High blood pressure.   Headaches.   Sudden weight gain.   Swelling of hands, face, legs, and feet.   Protein in the urine.   Feeling sick to your stomach (nauseous) and throwing up (vomiting).   Vision problems (blurred or double vision).   Numbness in the face, arms, legs, and feet.   Dizziness.   Slurred speech.   Preeclampsia can cause growth retardation in the fetus.   Separation (abruption) of the placenta.   Not enough fluid in the amniotic sac (oligohydramnios).   Sensitivity to bright lights.   Belly (abdominal) pain.  DIAGNOSIS   If protein is found in the urine in the second half of pregnancy, this is considered preeclampsia. Other symptoms mentioned above may also be present.  TREATMENT   It is necessary to treat this.   Your caregiver may prescribe bed rest early in this condition. Plenty of rest and  salt restriction may be all that is needed.   Medicines may be necessary to lower blood pressure if the condition does not respond to more conservative measures.   In more severe cases, hospitalization may be needed:   For treatment of blood pressure.   To control fluid retention.   To monitor the baby to see if the condition is causing harm to the baby.   Hospitalization is the best way to treat the first sign of preeclampsia. This is so the mother and baby can be watched closely and blood tests can be done effectively and correctly.   If the condition becomes severe, it may be necessary to induce labor or to remove the infant by surgical means (cesarean section). The best cure for preeclampsia/eclampsia is to deliver the baby.  Preeclampsia and eclampsia involve risks to mother and infant. Your caregiver will discuss these risks with you. Together, you can work out the best possible approach to your problems. Make sure you keep your prenatal visits as scheduled. Not keeping appointments could result in a chronic or permanent injury, pain, disability to you, and death or injury to you or your unborn baby. If there is any problem keeping the appointment, you must call to reschedule.  HOME CARE INSTRUCTIONS    Keep your prenatal appointments and tests as scheduled.   Tell your caregiver if you have any of the above risk factors.   Get plenty of rest and sleep.   Eat a balanced   diet that is low in salt, and do not add salt to your food.   Avoid stressful situations.   Only take over-the-counter and prescriptions medicines for pain, discomfort, or fever as directed by your caregiver.  SEEK IMMEDIATE MEDICAL CARE IF:    You develop severe swelling anywhere in the body. This usually occurs in the legs.   You gain 5 lb/2.3 kg or more in a week.   You develop a severe headache, dizziness, problems with your vision, or confusion.   You have abdominal pain, nausea, or vomiting.   You have a seizure.   You  have trouble moving any part of your body, or you develop numbness or problems speaking.   You have bruising or abnormal bleeding from anywhere in the body.   You develop a stiff neck.   You pass out.  MAKE SURE YOU:    Understand these instructions.   Will watch your condition.   Will get help right away if you are not doing well or get worse.  Document Released: 11/30/2000 Document Revised: 11/22/2011 Document Reviewed: 07/16/2008  [employer] Patient Information 2012 [employer].

## 2012-06-15 NOTE — Discharge Summary (Signed)
Physician Discharge Summary  Patient ID: Kayla Mclean MRN: [id number] DOB/AGE: 12/25/1985 26 y.o.  Admit date: 06/13/2012 Discharge date: 06/15/2012  Admission Diagnoses:Hypertension in pregnancy, 31 weeks  Discharge Diagnoses: Abnormal liver function tests Active Problems:  Prior pregnancy with fetal demise  H/O cesarean section complicating pregnancy   Discharged Condition: good  Hospital Course: Labs and vitals followed, normal except for LFT  Consults: None  Significant Diagnostic Studies: labs: 24 hr urine, CMP  Treatments:   Discharge Exam: Blood pressure 110/69, pulse 89, temperature 98.1 F (36.7 C), temperature source Oral, resp. rate 18, height 4' 9.5" (1.461 m), weight 64.048 kg (141 lb 3.2 oz), SpO2 100.00%. General appearance: alert, cooperative and no distress GI: soft, non-tender; bowel sounds normal; no masses,  no organomegaly and gravid Extremities: extremities normal, atraumatic, no cyanosis or edema Disposition: 01-Home or Self Care  Discharge Orders    Future Orders Please Complete By Expires   OB RESULTS CONSOLE GC/Chlamydia      Comments:   This external order was created through the Results Console.   OB RESULTS CONSOLE RPR      Comments:   This external order was created through the Results Console.   OB RESULTS CONSOLE HIV antibody      Comments:   This external order was created through the Results Console.   OB RESULTS CONSOLE Rubella Antibody      Comments:   This external order was created through the Results Console.   OB RESULTS CONSOLE Hepatitis B surface antigen      Comments:   This external order was created through the Results Console.   OB RESULTS CONSOLE ABO/Rh      Comments:   This external order was created through the Results Console.   OB RESULTS CONSOLE Antibody Screen      Comments:   This external order was created through the Results Console.     Medication List  As of 06/15/2012  6:32 AM   TAKE these medications          omeprazole 40 MG capsule   Commonly known as: PRILOSEC   Take 1 capsule (40 mg total) by mouth daily.      prenatal multivitamin Tabs   Take 1 tablet by mouth daily.           Follow-up Information    Follow up with [facility] in 1 week. (keep appt at [facility] this week)        Hepatitis panel pending  Signed: Chaitanya Amedee 06/15/2012, 6:32 AM

## 2012-06-16 LAB — HEPATITIS PANEL, ACUTE: Hep B C IgM: NEGATIVE

## 2012-06-16 NOTE — Progress Notes (Signed)
Post discharge review completed. 

## 2012-06-17 DIAGNOSIS — O34219 Maternal care for unspecified type scar from previous cesarean delivery: Secondary | ICD-10-CM

## 2012-06-17 DIAGNOSIS — O09299 Supervision of pregnancy with other poor reproductive or obstetric history, unspecified trimester: Secondary | ICD-10-CM

## 2012-06-17 NOTE — [facility] Provider Note (Signed)
Pt's protein/creatinine ratio returned borderline nml.  Pt called back inf or further testing on 06/13/12.  Agree with above note.  LEGGETT,KELLY H. 06/17/2012 8:12 PM

## 2012-06-20 NOTE — Progress Notes (Signed)
Attestation of Attending Supervision of Advanced Practitioner: Evaluation and management procedures were performed by the PA/NP/CNM/OB Fellow under my supervision/collaboration. Chart reviewed and agree with management and plan.  Sherlyne Crownover V 06/20/2012 7:38 AM

## 2012-06-20 NOTE — H&P (Signed)
Attestation of Attending Supervision of Advanced Practitioner: Evaluation and management procedures were performed by the PA/NP/CNM/OB Fellow under my supervision/collaboration. Chart reviewed and agree with management and plan.  Vincenta Steffey V 06/20/2012 7:40 AM

## 2012-06-23 ENCOUNTER — Ambulatory Visit (INDEPENDENT_AMBULATORY_CARE_PROVIDER_SITE_OTHER): Payer: Medicaid Other | Admitting: Obstetrics & Gynecology

## 2012-06-23 ENCOUNTER — Ambulatory Visit (INDEPENDENT_AMBULATORY_CARE_PROVIDER_SITE_OTHER): Payer: Medicaid Other | Admitting: *Deleted

## 2012-06-23 VITALS — BP 104/72 | Wt 138.0 lb

## 2012-06-23 VITALS — BP 104/72 | Temp 97.7°F | Ht <= 58 in | Wt 138.5 lb

## 2012-06-23 DIAGNOSIS — O09299 Supervision of pregnancy with other poor reproductive or obstetric history, unspecified trimester: Secondary | ICD-10-CM

## 2012-06-23 DIAGNOSIS — R748 Abnormal levels of other serum enzymes: Secondary | ICD-10-CM

## 2012-06-23 DIAGNOSIS — O0993 Supervision of high risk pregnancy, unspecified, third trimester: Secondary | ICD-10-CM | POA: Insufficient documentation

## 2012-06-23 LAB — POCT URINALYSIS DIP (DEVICE)
Hgb urine dipstick: NEGATIVE
Ketones, ur: NEGATIVE mg/dL
Protein, ur: NEGATIVE mg/dL
Specific Gravity, Urine: 1.02 (ref 1.005–1.030)

## 2012-06-23 LAB — COMPREHENSIVE METABOLIC PANEL
Albumin: 3.6 g/dL (ref 3.5–5.2)
BUN: 5 mg/dL — ABNORMAL LOW (ref 6–23)
CO2: 26 mEq/L (ref 19–32)
Glucose, Bld: 71 mg/dL (ref 70–99)
Sodium: 134 mEq/L — ABNORMAL LOW (ref 135–145)
Total Bilirubin: 0.4 mg/dL (ref 0.3–1.2)
Total Protein: 6.6 g/dL (ref 6.0–8.3)

## 2012-06-23 NOTE — Progress Notes (Signed)
Pulse 80 Pt reports that at night she is itching all over her body, doesn't happen during the day. Needs to see Nutrition and SW

## 2012-06-23 NOTE — Progress Notes (Signed)
BP nml.  24 hour urine = 123; hepatitis panel neg.  Will check bile salts.  Will start twice weekly testing for history of IUFD.

## 2012-06-23 NOTE — Addendum Note (Signed)
Addended by: Doreen Salvage on: 06/23/2012 11:46 AM   Modules accepted: Orders

## 2012-06-23 NOTE — Progress Notes (Signed)
NST reactive.

## 2012-06-25 LAB — BILE ACIDS, TOTAL: Bile Acids Total: 20 umol/L — ABNORMAL HIGH (ref 0–19)

## 2012-06-26 ENCOUNTER — Ambulatory Visit (INDEPENDENT_AMBULATORY_CARE_PROVIDER_SITE_OTHER): Payer: Medicaid Other | Admitting: *Deleted

## 2012-06-26 VITALS — BP 117/68 | Wt 142.2 lb

## 2012-06-26 DIAGNOSIS — O09299 Supervision of pregnancy with other poor reproductive or obstetric history, unspecified trimester: Secondary | ICD-10-CM

## 2012-06-26 NOTE — Progress Notes (Signed)
NST 06/26/12 reactive 

## 2012-06-26 NOTE — Progress Notes (Signed)
Nutrition Notes: (1st visit consult) Pt seen for previous fetal demise.  Pt has adequate wt gain of 14.5# @ [redacted]w[redacted]d.  Pt reports good intake, no food allergies, and no N/V. Pt gets a wide variety of foods. Disc wt gain goals of 15-25#. Pt does receive WIC services and is undecided about  BF. Follow up if referred.  Wilford Sports, RD

## 2012-06-26 NOTE — Progress Notes (Signed)
P-85 

## 2012-06-27 NOTE — Progress Notes (Signed)
Reviewed case with MFM.  Pt has elevated liver enzymes, bile acids mildly elevated, and itching.  Will treat as cholestasis of pregnancy and deliver at 37 weeks.  Needs to continue 2x weekly testing.

## 2012-06-30 DIAGNOSIS — O34219 Maternal care for unspecified type scar from previous cesarean delivery: Secondary | ICD-10-CM

## 2012-06-30 DIAGNOSIS — O09299 Supervision of pregnancy with other poor reproductive or obstetric history, unspecified trimester: Secondary | ICD-10-CM

## 2012-07-03 ENCOUNTER — Ambulatory Visit (INDEPENDENT_AMBULATORY_CARE_PROVIDER_SITE_OTHER): Payer: Medicaid Other | Admitting: *Deleted

## 2012-07-03 VITALS — BP 113/72 | Wt 143.0 lb

## 2012-07-03 DIAGNOSIS — K839 Disease of biliary tract, unspecified: Secondary | ICD-10-CM

## 2012-07-03 DIAGNOSIS — K831 Obstruction of bile duct: Secondary | ICD-10-CM | POA: Insufficient documentation

## 2012-07-03 DIAGNOSIS — O26649 Intrahepatic cholestasis of pregnancy, unspecified trimester: Secondary | ICD-10-CM | POA: Insufficient documentation

## 2012-07-03 DIAGNOSIS — O26619 Liver and biliary tract disorders in pregnancy, unspecified trimester: Secondary | ICD-10-CM

## 2012-07-03 DIAGNOSIS — O09299 Supervision of pregnancy with other poor reproductive or obstetric history, unspecified trimester: Secondary | ICD-10-CM

## 2012-07-03 DIAGNOSIS — K769 Liver disease, unspecified: Secondary | ICD-10-CM

## 2012-07-03 NOTE — Progress Notes (Signed)
P = 87 

## 2012-07-03 NOTE — Progress Notes (Signed)
NST reviewed and reactive.  

## 2012-07-07 ENCOUNTER — Ambulatory Visit (INDEPENDENT_AMBULATORY_CARE_PROVIDER_SITE_OTHER): Payer: Medicaid Other | Admitting: Advanced Practice Midwife

## 2012-07-07 VITALS — BP 119/79 | Temp 97.0°F | Wt 140.0 lb

## 2012-07-07 DIAGNOSIS — O09299 Supervision of pregnancy with other poor reproductive or obstetric history, unspecified trimester: Secondary | ICD-10-CM

## 2012-07-07 DIAGNOSIS — K831 Obstruction of bile duct: Secondary | ICD-10-CM

## 2012-07-07 LAB — COMPREHENSIVE METABOLIC PANEL
Alkaline Phosphatase: 175 U/L — ABNORMAL HIGH (ref 39–117)
Glucose, Bld: 121 mg/dL — ABNORMAL HIGH (ref 70–99)
Sodium: 138 mEq/L (ref 135–145)
Total Bilirubin: 0.8 mg/dL (ref 0.3–1.2)
Total Protein: 6.5 g/dL (ref 6.0–8.3)

## 2012-07-07 LAB — POCT URINALYSIS DIP (DEVICE)
Hgb urine dipstick: NEGATIVE
Nitrite: NEGATIVE
Urobilinogen, UA: 0.2 mg/dL (ref 0.0–1.0)
pH: 6.5 (ref 5.0–8.0)

## 2012-07-07 LAB — CBC
MCH: 28.6 pg (ref 26.0–34.0)
MCHC: 33.8 g/dL (ref 30.0–36.0)
Platelets: 158 10*3/uL (ref 150–400)

## 2012-07-07 NOTE — Progress Notes (Signed)
Patient doing well. Desires BTL again (previously changed her mind). Itching all over 2/2 cholestasis. Rx given for ursodiol (Actigall) 300mg  TID and benadryl at bedtime as needed. Patient informed she will deliver at 37 weeks. Continue 2x/wk testing.

## 2012-07-07 NOTE — Progress Notes (Signed)
Pulse: 85

## 2012-07-07 NOTE — Patient Instructions (Signed)
Cholestasis of Pregnancy Cholestasis is a stopping or restriction of bile flow from the gallbladder into the intestine. It can be mild to severe. This problem usually happens during the final trimester of pregnancy. This condition occurs because the liver is not getting rid of its bile. The bile is part of the breakdown products of red blood cells. When these breakdown products (bilirubin) become elevated in the liver and spill into the blood, they produce jaundice. Jaundice is a yellowing of the skin. It is often seen first in the whites of the eyes. As the bilirubin becomes more elevated, it causes itching and sometimes a rash of the skin. CAUSES   Having problems inside or outside of the liver (gallstones blocking the opening of the gallbladder).   During pregnancy, it may be due to the elevated estrogen in the blood.   A response to the estrogen in birth control pills.   Drug or alcohol use.   Certain types of anemia (low red blood cells) present before the pregnancy. Let your caregiver know if you have anemia.   Jaundice in pregnancy is also seen with severe toxemia, severe and continuous vomiting (hyperemesis gravidarum), and acute fatty liver of pregnancy.   Other causes of jaundice that should be ruled out are hepatitis, chronic liver disease (cirrhosis), autoimmune hepatitis, and severe infectious mononucleosis.  This problem is usually harmless. However, it can cause early(premature) birth and threaten infant health. This type of jaundice is similar to jaundice of the newborn. If bilirubin levels become high enough, it can cause mental retardation in the baby. SYMPTOMS   Itching, mild to severe.   Jaundice.   Rash at times.  DIAGNOSIS   Increase amount of bilirubin in the blood stream.   The patient has yellow skin and the white of their eyes (jaundice).  TREATMENT  This problem disappears with delivery of the baby. Treatment is usually directed at controlling the itching. If  the problem is severe and the jaundice reaches dangerous levels, labor may be induced or a cesarean section may be performed. This does not usually cause permanent liver damage in the mother. However, the cholestasis may occur in future pregnancies. HOME CARE INSTRUCTIONS   Do not use drugs or alcohol.   Do not take any medication unless suggested by your caregiver.   Keep all follow-up appointments as directed. This is important for you and your baby's health.   Only use the creams and medications for the itching given to you by your caregiver.  SEEK IMMEDIATE MEDICAL CARE IF:   An unexplained oral temperature above 102 F (38.9 C) develops, or as your caregiver suggests.   Symptoms are not controlled by the medications or measures suggested by your caregiver. You seem to be getting worse.   You develop nausea, vomiting, or abdominal pain.   You develop a severe headache.   You develop vision problems (blurred or double vision).   You develop uterine contractions.   You develop vaginal bleeding.  Document Released: 11/30/2000 Document Revised: 11/22/2011 Document Reviewed: 06/23/2008 [facility] Patient Information 2012 [place], [place]. Normal Labor and Delivery Your caregiver must first be sure you are in labor. Signs of labor include:  You may pass what is called "the mucus plug" before labor begins. This is a small amount of blood stained mucus.   Regular uterine contractions.   The time between contractions get closer together.   The discomfort and pain gradually gets more intense.   Pains are mostly located in the back.  Pains get worse when walking.   The cervix (the opening of the uterus becomes thinner (begins to efface) and opens up (dilates).  Once you are in labor and admitted into the hospital or care center, your caregiver will do the following:  A complete physical examination.   Check your vital signs (blood pressure, pulse, temperature and the fetal  heart rate).   Do a vaginal examination (using a sterile glove and lubricant) to determine:   The position (presentation) of the baby (head [vertex] or buttock first).   The level (station) of the baby's head in the birth canal.   The effacement and dilatation of the cervix.   You may have your pubic hair shaved and be given an enema depending on your caregiver and the circumstance.   An electronic monitor is usually placed on your abdomen. The monitor follows the length and intensity of the contractions, as well as the baby's heart rate.   Usually, your caregiver will insert an IV in your arm with a bottle of sugar water. This is done as a precaution so that medications can be given to you quickly during labor or delivery.  NORMAL LABOR AND DELIVERY IS DIVIDED UP INTO 3 STAGES: First Stage This is when regular contractions begin and the cervix begins to efface and dilate. This stage can last from 3 to 15 hours. The end of the first stage is when the cervix is 100% effaced and 10 centimeters dilated. Pain medications may be given by   Injection (morphine, demerol, etc.)   Regional anesthesia (spinal, caudal or epidural, anesthetics given in different locations of the spine). Paracervical pain medication may be given, which is an injection of and anesthetic on each side of the cervix.  A pregnant woman may request to have "Natural Childbirth" which is not to have any medications or anesthesia during her labor and delivery. Second Stage This is when the baby comes down through the birth canal (vagina) and is born. This can take 1 to 4 hours. As the baby's head comes down through the birth canal, you may feel like you are going to have a bowel movement. You will get the urge to bear down and push until the baby is delivered. As the baby's head is being delivered, the caregiver will decide if an episiotomy (a cut in the perineum and vagina area) is needed to prevent tearing of the tissue in this  area. The episiotomy is sewn up after the delivery of the baby and placenta. Sometimes a mask with nitrous oxide is given for the mother to breath during the delivery of the baby to help if there is too much pain. The end of Stage 2 is when the baby is fully delivered. Then when the umbilical cord stops pulsating it is clamped and cut. Third Stage The third stage begins after the baby is completely delivered and ends after the placenta (afterbirth) is delivered. This usually takes 5 to 30 minutes. After the placenta is delivered, a medication is given either by intravenous or injection to help contract the uterus and prevent bleeding. The third stage is not painful and pain medication is usually not necessary. If an episiotomy was done, it is repaired at this time. After the delivery, the mother is watched and monitored closely for 1 to 2 hours to make sure there is no postpartum bleeding (hemorrhage). If there is a lot of bleeding, medication is given to contract the uterus and stop the bleeding. Document Released: 09/11/2008  Document Revised: 11/22/2011 Document Reviewed: 09/11/2008 [facility] Patient Information 2012 [place], [place].  Fetal Movement Counts Patient Name: __________________________________________________ Patient Due Date: ____________________ Melody Haver counts is highly recommended in high risk pregnancies, but it is a good idea for every pregnant woman to do. Start counting fetal movements at 28 weeks of the pregnancy. Fetal movements increase after eating a full meal or eating or drinking something sweet (the blood sugar is higher). It is also important to drink plenty of fluids (well hydrated) before doing the count. Lie on your left side because it helps with the circulation or you can sit in a comfortable chair with your arms over your belly (abdomen) with no distractions around you. DOING THE COUNT  Try to do the count the same time of day each time you do it.   Mark the day and time,  then see how long it takes for you to feel 10 movements (kicks, flutters, swishes, rolls). You should have at least 10 movements within 2 hours. You will most likely feel 10 movements in much less than 2 hours. If you do not, wait an hour and count again. After a couple of days you will see a pattern.   What you are looking for is a change in the pattern or not enough counts in 2 hours. Is it taking longer in time to reach 10 movements?  SEEK MEDICAL CARE IF:  You feel less than 10 counts in 2 hours. Tried twice.   No movement in one hour.   The pattern is changing or taking longer each day to reach 10 counts in 2 hours.   You feel the baby is not moving as it usually does.  Date: ____________ Movements: ____________ Start time: ____________ Doreatha Martin time: ____________  Date: ____________ Movements: ____________ Start time: ____________ Doreatha Martin time: ____________ Date: ____________ Movements: ____________ Start time: ____________ Doreatha Martin time: ____________ Date: ____________ Movements: ____________ Start time: ____________ Doreatha Martin time: ____________ Date: ____________ Movements: ____________ Start time: ____________ Doreatha Martin time: ____________ Date: ____________ Movements: ____________ Start time: ____________ Doreatha Martin time: ____________ Date: ____________ Movements: ____________ Start time: ____________ Doreatha Martin time: ____________ Date: ____________ Movements: ____________ Start time: ____________ Doreatha Martin time: ____________  Date: ____________ Movements: ____________ Start time: ____________ Doreatha Martin time: ____________ Date: ____________ Movements: ____________ Start time: ____________ Doreatha Martin time: ____________ Date: ____________ Movements: ____________ Start time: ____________ Doreatha Martin time: ____________ Date: ____________ Movements: ____________ Start time: ____________ Doreatha Martin time: ____________ Date: ____________ Movements: ____________ Start time: ____________ Doreatha Martin time: ____________ Date:  ____________ Movements: ____________ Start time: ____________ Doreatha Martin time: ____________ Date: ____________ Movements: ____________ Start time: ____________ Doreatha Martin time: ____________  Date: ____________ Movements: ____________ Start time: ____________ Doreatha Martin time: ____________ Date: ____________ Movements: ____________ Start time: ____________ Doreatha Martin time: ____________ Date: ____________ Movements: ____________ Start time: ____________ Doreatha Martin time: ____________ Date: ____________ Movements: ____________ Start time: ____________ Doreatha Martin time: ____________ Date: ____________ Movements: ____________ Start time: ____________ Doreatha Martin time: ____________ Date: ____________ Movements: ____________ Start time: ____________ Doreatha Martin time: ____________ Date: ____________ Movements: ____________ Start time: ____________ Doreatha Martin time: ____________  Date: ____________ Movements: ____________ Start time: ____________ Doreatha Martin time: ____________ Date: ____________ Movements: ____________ Start time: ____________ Doreatha Martin time: ____________ Date: ____________ Movements: ____________ Start time: ____________ Doreatha Martin time: ____________ Date: ____________ Movements: ____________ Start time: ____________ Doreatha Martin time: ____________ Date: ____________ Movements: ____________ Start time: ____________ Doreatha Martin time: ____________ Date: ____________ Movements: ____________ Start time: ____________ Doreatha Martin time: ____________ Date: ____________ Movements: ____________ Start time: ____________ Doreatha Martin time: ____________  Date: ____________ Movements: ____________ Start time: ____________ Doreatha Martin time: ____________ Date: ____________ Movements:  ____________ Start time: ____________ Doreatha Martin time: ____________ Date: ____________ Movements: ____________ Start time: ____________ Doreatha Martin time: ____________ Date: ____________ Movements: ____________ Start time: ____________ Doreatha Martin time: ____________ Date: ____________ Movements: ____________ Start  time: ____________ Doreatha Martin time: ____________ Date: ____________ Movements: ____________ Start time: ____________ Doreatha Martin time: ____________ Date: ____________ Movements: ____________ Start time: ____________ Doreatha Martin time: ____________  Date: ____________ Movements: ____________ Start time: ____________ Doreatha Martin time: ____________ Date: ____________ Movements: ____________ Start time: ____________ Doreatha Martin time: ____________ Date: ____________ Movements: ____________ Start time: ____________ Doreatha Martin time: ____________ Date: ____________ Movements: ____________ Start time: ____________ Doreatha Martin time: ____________ Date: ____________ Movements: ____________ Start time: ____________ Doreatha Martin time: ____________ Date: ____________ Movements: ____________ Start time: ____________ Doreatha Martin time: ____________ Date: ____________ Movements: ____________ Start time: ____________ Doreatha Martin time: ____________  Date: ____________ Movements: ____________ Start time: ____________ Doreatha Martin time: ____________ Date: ____________ Movements: ____________ Start time: ____________ Doreatha Martin time: ____________ Date: ____________ Movements: ____________ Start time: ____________ Doreatha Martin time: ____________ Date: ____________ Movements: ____________ Start time: ____________ Doreatha Martin time: ____________ Date: ____________ Movements: ____________ Start time: ____________ Doreatha Martin time: ____________ Date: ____________ Movements: ____________ Start time: ____________ Doreatha Martin time: ____________ Date: ____________ Movements: ____________ Start time: ____________ Doreatha Martin time: ____________  Date: ____________ Movements: ____________ Start time: ____________ Doreatha Martin time: ____________ Date: ____________ Movements: ____________ Start time: ____________ Doreatha Martin time: ____________ Date: ____________ Movements: ____________ Start time: ____________ Doreatha Martin time: ____________ Date: ____________ Movements: ____________ Start time: ____________ Doreatha Martin time:  ____________ Date: ____________ Movements: ____________ Start time: ____________ Doreatha Martin time: ____________ Date: ____________ Movements: ____________ Start time: ____________ Doreatha Martin time: ____________ Document Released: 01/02/2007 Document Revised: 11/22/2011 Document Reviewed: 07/05/2009 [employer] Patient Information 2012 [employer].

## 2012-07-07 NOTE — Progress Notes (Signed)
CBC and CMET. R/B/I BTL reviewed including bleeding, infection, damage to nearby organs and regret. Discussed LARC. NST category I.

## 2012-07-10 ENCOUNTER — Ambulatory Visit (INDEPENDENT_AMBULATORY_CARE_PROVIDER_SITE_OTHER): Payer: Medicaid Other | Admitting: *Deleted

## 2012-07-10 VITALS — BP 109/66 | Wt 141.1 lb

## 2012-07-10 DIAGNOSIS — O09299 Supervision of pregnancy with other poor reproductive or obstetric history, unspecified trimester: Secondary | ICD-10-CM

## 2012-07-10 DIAGNOSIS — O26619 Liver and biliary tract disorders in pregnancy, unspecified trimester: Secondary | ICD-10-CM

## 2012-07-10 DIAGNOSIS — K831 Obstruction of bile duct: Secondary | ICD-10-CM

## 2012-07-10 DIAGNOSIS — K838 Other specified diseases of biliary tract: Secondary | ICD-10-CM

## 2012-07-10 DIAGNOSIS — K769 Liver disease, unspecified: Secondary | ICD-10-CM

## 2012-07-10 DIAGNOSIS — K839 Disease of biliary tract, unspecified: Secondary | ICD-10-CM

## 2012-07-10 MED ORDER — DIPHENHYDRAMINE HCL 25 MG PO CAPS: 25.0000 mg | ORAL_CAPSULE | Freq: Every evening | ORAL | Status: DC | PRN

## 2012-07-10 MED ORDER — URSODIOL 300 MG PO CAPS: 300.0000 mg | ORAL_CAPSULE | Freq: Three times a day (TID) | ORAL | Status: AC

## 2012-07-10 NOTE — Progress Notes (Signed)
P-84  

## 2012-07-11 ENCOUNTER — Telehealth (HOSPITAL_COMMUNITY): Payer: Self-pay | Admitting: *Deleted

## 2012-07-11 ENCOUNTER — Encounter (HOSPITAL_COMMUNITY): Payer: Self-pay | Admitting: *Deleted

## 2012-07-11 NOTE — Telephone Encounter (Signed)
Preadmission screen  

## 2012-07-14 ENCOUNTER — Ambulatory Visit (INDEPENDENT_AMBULATORY_CARE_PROVIDER_SITE_OTHER): Payer: Medicaid Other | Admitting: Obstetrics and Gynecology

## 2012-07-14 VITALS — BP 126/85 | Temp 97.4°F | Wt 140.3 lb

## 2012-07-14 DIAGNOSIS — O09299 Supervision of pregnancy with other poor reproductive or obstetric history, unspecified trimester: Secondary | ICD-10-CM

## 2012-07-14 DIAGNOSIS — K769 Liver disease, unspecified: Secondary | ICD-10-CM

## 2012-07-14 DIAGNOSIS — K831 Obstruction of bile duct: Secondary | ICD-10-CM

## 2012-07-14 DIAGNOSIS — O26619 Liver and biliary tract disorders in pregnancy, unspecified trimester: Secondary | ICD-10-CM

## 2012-07-14 DIAGNOSIS — O34219 Maternal care for unspecified type scar from previous cesarean delivery: Secondary | ICD-10-CM

## 2012-07-14 DIAGNOSIS — O0993 Supervision of high risk pregnancy, unspecified, third trimester: Secondary | ICD-10-CM

## 2012-07-14 DIAGNOSIS — K839 Disease of biliary tract, unspecified: Secondary | ICD-10-CM

## 2012-07-14 LAB — POCT URINALYSIS DIP (DEVICE)
Ketones, ur: NEGATIVE mg/dL
Protein, ur: NEGATIVE mg/dL
Specific Gravity, Urine: 1.01 (ref 1.005–1.030)
pH: 5.5 (ref 5.0–8.0)

## 2012-07-14 LAB — OB RESULTS CONSOLE GBS: GBS: NEGATIVE

## 2012-07-14 NOTE — Progress Notes (Signed)
NST reviewed and reactive. Cultures performed. FM/labor precautions reviewed. Patient scheduled for IOL on 8/7 at 0630.

## 2012-07-14 NOTE — Progress Notes (Signed)
Pulse- 93  Edema-ankles, feet  Pain-"when sleeping have pain on side"

## 2012-07-15 LAB — GC/CHLAMYDIA PROBE AMP, GENITAL: GC Probe Amp, Genital: NEGATIVE

## 2012-07-17 ENCOUNTER — Ambulatory Visit (INDEPENDENT_AMBULATORY_CARE_PROVIDER_SITE_OTHER): Payer: Medicaid Other | Admitting: *Deleted

## 2012-07-17 VITALS — BP 121/75 | Wt 141.9 lb

## 2012-07-17 DIAGNOSIS — O26619 Liver and biliary tract disorders in pregnancy, unspecified trimester: Secondary | ICD-10-CM

## 2012-07-17 DIAGNOSIS — K839 Disease of biliary tract, unspecified: Secondary | ICD-10-CM

## 2012-07-17 DIAGNOSIS — O09299 Supervision of pregnancy with other poor reproductive or obstetric history, unspecified trimester: Secondary | ICD-10-CM

## 2012-07-17 DIAGNOSIS — K769 Liver disease, unspecified: Secondary | ICD-10-CM

## 2012-07-17 LAB — CULTURE, BETA STREP (GROUP B ONLY)

## 2012-07-17 NOTE — Progress Notes (Signed)
NST performed today was reviewed and was found to be reactive.  AFI 19.6 cm. Continue recommended antenatal testing and prenatal care.

## 2012-07-17 NOTE — Progress Notes (Signed)
P = 91 

## 2012-07-21 ENCOUNTER — Ambulatory Visit (INDEPENDENT_AMBULATORY_CARE_PROVIDER_SITE_OTHER): Payer: Medicaid Other | Admitting: Family Medicine

## 2012-07-21 ENCOUNTER — Encounter: Payer: Self-pay | Admitting: Family Medicine

## 2012-07-21 VITALS — BP 115/80 | Temp 98.0°F | Wt 141.2 lb

## 2012-07-21 DIAGNOSIS — K839 Disease of biliary tract, unspecified: Secondary | ICD-10-CM

## 2012-07-21 DIAGNOSIS — O34219 Maternal care for unspecified type scar from previous cesarean delivery: Secondary | ICD-10-CM

## 2012-07-21 DIAGNOSIS — O26619 Liver and biliary tract disorders in pregnancy, unspecified trimester: Secondary | ICD-10-CM

## 2012-07-21 DIAGNOSIS — K831 Obstruction of bile duct: Secondary | ICD-10-CM

## 2012-07-21 DIAGNOSIS — Z23 Encounter for immunization: Secondary | ICD-10-CM

## 2012-07-21 DIAGNOSIS — O0993 Supervision of high risk pregnancy, unspecified, third trimester: Secondary | ICD-10-CM

## 2012-07-21 DIAGNOSIS — O09299 Supervision of pregnancy with other poor reproductive or obstetric history, unspecified trimester: Secondary | ICD-10-CM

## 2012-07-21 DIAGNOSIS — K769 Liver disease, unspecified: Secondary | ICD-10-CM

## 2012-07-21 LAB — POCT URINALYSIS DIP (DEVICE)
Glucose, UA: NEGATIVE mg/dL
Nitrite: NEGATIVE
Specific Gravity, Urine: 1.025 (ref 1.005–1.030)

## 2012-07-21 MED ORDER — TETANUS-DIPHTH-ACELL PERTUSSIS 5-2.5-18.5 LF-MCG/0.5 IM SUSP
0.5000 mL | Freq: Once | INTRAMUSCULAR | Status: AC
  Administered 2012-07-21: 0.5 mL via INTRAMUSCULAR

## 2012-07-21 NOTE — Progress Notes (Signed)
P=90, Trace edema in feet only,

## 2012-07-21 NOTE — Progress Notes (Signed)
NST reviewed and reactive.  IOL on 07/23/2012--likely will ned foley ripening.

## 2012-07-21 NOTE — Patient Instructions (Signed)
Breastfeeding BENEFITS OF BREASTFEEDING For the baby  The first milk (colostrum) helps the baby's digestive system function better.   There are antibodies from the mother in the milk that help the baby fight off infections.   The baby has a lower incidence of asthma, allergies, and SIDS (sudden infant death syndrome).   The nutrients in breast milk are better than formulas for the baby and helps the baby's brain grow better.   Babies who breastfeed have less gas, colic, and constipation.  For the mother  Breastfeeding helps develop a very special bond between mother and baby.   It is more convenient, always available at the correct temperature and cheaper than formula feeding.   It burns calories in the mother and helps with losing weight that was gained during pregnancy.   It makes the uterus contract back down to normal size faster and slows bleeding following delivery.   Breastfeeding mothers have a lower risk of developing breast cancer.  NURSE FREQUENTLY  A healthy, full-term baby may breastfeed as often as every hour or space his or her feedings to every 3 hours.   How often to nurse will vary from baby to baby. Watch your baby for signs of hunger, not the clock.   Nurse as often as the baby requests, or when you feel the need to reduce the fullness of your breasts.   Awaken the baby if it has been 3 to 4 hours since the last feeding.   Frequent feeding will help the mother make more milk and will prevent problems like sore nipples and engorgement of the breasts.  BABY'S POSITION AT THE BREAST  Whether lying down or sitting, be sure that the baby's tummy is facing your tummy.   Support the breast with 4 fingers underneath the breast and the thumb above. Make sure your fingers are well away from the nipple and baby's mouth.   Stroke the baby's lips and cheek closest to the breast gently with your finger or nipple.   When the baby's mouth is open wide enough, place all  of your nipple and as much of the dark area around the nipple as possible into your baby's mouth.   Pull the baby in close so the tip of the nose and the baby's cheeks touch the breast during the feeding.  FEEDINGS  The length of each feeding varies from baby to baby and from feeding to feeding.   The baby must suck about 2 to 3 minutes for your milk to get to him or her. This is called a "let down." For this reason, allow the baby to feed on each breast as long as he or she wants. Your baby will end the feeding when he or she has received the right balance of nutrients.   To break the suction, put your finger into the corner of the baby's mouth and slide it between his or her gums before removing your breast from his or her mouth. This will help prevent sore nipples.  REDUCING BREAST ENGORGEMENT  In the first week after your baby is born, you may experience signs of breast engorgement. When breasts are engorged, they feel heavy, warm, full, and may be tender to the touch. You can reduce engorgement if you:   Nurse frequently, every 2 to 3 hours. Mothers who breastfeed early and often have fewer problems with engorgement.   Place light ice packs on your breasts between feedings. This reduces swelling. Wrap the ice packs in a   lightweight towel to protect your skin.   Apply moist hot packs to your breast for 5 to 10 minutes before each feeding. This increases circulation and helps the milk flow.   Gently massage your breast before and during the feeding.   Make sure that the baby empties at least one breast at every feeding before switching sides.   Use a breast pump to empty the breasts if your baby is sleepy or not nursing well. You may also want to pump if you are returning to work or or you feel you are getting engorged.   Avoid bottle feeds, pacifiers or supplemental feedings of water or juice in place of breastfeeding.   Be sure the baby is latched on and positioned properly while  breastfeeding.   Prevent fatigue, stress, and anemia.   Wear a supportive bra, avoiding underwire styles.   Eat a balanced diet with enough fluids.  If you follow these suggestions, your engorgement should improve in 24 to 48 hours. If you are still experiencing difficulty, call your lactation consultant or caregiver. IS MY BABY GETTING ENOUGH MILK? Sometimes, mothers worry about whether their babies are getting enough milk. You can be assured that your baby is getting enough milk if:  The baby is actively sucking and you hear swallowing.   The baby nurses at least 8 to 12 times in a 24 hour time period. Nurse your baby until he or she unlatches or falls asleep at the first breast (at least 10 to 20 minutes), then offer the second side.   The baby is wetting 5 to 6 disposable diapers (6 to 8 cloth diapers) in a 24 hour period by 5 to 6 days of age.   The baby is having at least 2 to 3 stools every 24 hours for the first few months. Breast milk is all the food your baby needs. It is not necessary for your baby to have water or formula. In fact, to help your breasts make more milk, it is best not to give your baby supplemental feedings during the early weeks.   The stool should be soft and yellow.   The baby should gain 4 to 7 ounces per week after he is 4 days old.  TAKE CARE OF YOURSELF Take care of your breasts by:  Bathing or showering daily.   Avoiding the use of soaps on your nipples.   Start feedings on your left breast at one feeding and on your right breast at the next feeding.   You will notice an increase in your milk supply 2 to 5 days after delivery. You may feel some discomfort from engorgement, which makes your breasts very firm and often tender. Engorgement "peaks" out within 24 to 48 hours. In the meantime, apply warm moist towels to your breasts for 5 to 10 minutes before feeding. Gentle massage and expression of some milk before feeding will soften your breasts, making  it easier for your baby to latch on. Wear a well fitting nursing bra and air dry your nipples for 10 to 15 minutes after each feeding.   Only use cotton bra pads.   Only use pure lanolin on your nipples after nursing. You do not need to wash it off before nursing.  Take care of yourself by:   Eating well-balanced meals and nutritious snacks.   Drinking milk, fruit juice, and water to satisfy your thirst (about 8 glasses a day).   Getting plenty of rest.   Increasing calcium in   your diet (1200 mg a day).   Avoiding foods that you notice affect the baby in a bad way.  SEEK MEDICAL CARE IF:   You have any questions or difficulty with breastfeeding.   You need help.   You have a hard, red, sore area on your breast, accompanied by a fever of 100.5 F (38.1 C) or more.   Your baby is too sleepy to eat well or is having trouble sleeping.   Your baby is wetting less than 6 diapers per day, by 5 days of age.   Your baby's skin or white part of his or her eyes is more yellow than it was in the hospital.   You feel depressed.  Document Released: 12/03/2005 Document Revised: 11/22/2011 Document Reviewed: 07/18/2009 [employer] Patient Information 2012 [employer]. 

## 2012-07-23 ENCOUNTER — Inpatient Hospital Stay (HOSPITAL_COMMUNITY): Payer: Medicaid Other | Admitting: Anesthesiology

## 2012-07-23 ENCOUNTER — Encounter (HOSPITAL_COMMUNITY)
Admission: RE | Disposition: A | Discharge status: Home / Self Care | Payer: Self-pay | Source: Ambulatory Visit | Attending: Obstetrics & Gynecology

## 2012-07-23 ENCOUNTER — Inpatient Hospital Stay (HOSPITAL_COMMUNITY)
Admission: RE | Admit: 2012-07-23 | Discharge: 2012-07-25 | DRG: 765 | Disposition: A | Discharge status: Home / Self Care | Payer: Medicaid Other | Source: Ambulatory Visit | Attending: Obstetrics & Gynecology | Admitting: Obstetrics & Gynecology

## 2012-07-23 ENCOUNTER — Encounter (HOSPITAL_COMMUNITY): Payer: Self-pay

## 2012-07-23 ENCOUNTER — Encounter (HOSPITAL_COMMUNITY): Payer: Self-pay | Admitting: Anesthesiology

## 2012-07-23 VITALS — BP 110/78 | HR 87 | Temp 97.7°F | Resp 18 | Ht 60.0 in | Wt 141.0 lb

## 2012-07-23 DIAGNOSIS — O26619 Liver and biliary tract disorders in pregnancy, unspecified trimester: Secondary | ICD-10-CM

## 2012-07-23 DIAGNOSIS — Z302 Encounter for sterilization: Secondary | ICD-10-CM

## 2012-07-23 DIAGNOSIS — O324XX0 Maternal care for high head at term, not applicable or unspecified: Secondary | ICD-10-CM

## 2012-07-23 DIAGNOSIS — Z98891 History of uterine scar from previous surgery: Secondary | ICD-10-CM

## 2012-07-23 DIAGNOSIS — O09299 Supervision of pregnancy with other poor reproductive or obstetric history, unspecified trimester: Secondary | ICD-10-CM

## 2012-07-23 DIAGNOSIS — N736 Female pelvic peritoneal adhesions (postinfective): Secondary | ICD-10-CM | POA: Diagnosis present

## 2012-07-23 DIAGNOSIS — K831 Obstruction of bile duct: Secondary | ICD-10-CM

## 2012-07-23 DIAGNOSIS — O34219 Maternal care for unspecified type scar from previous cesarean delivery: Secondary | ICD-10-CM

## 2012-07-23 DIAGNOSIS — K838 Other specified diseases of biliary tract: Secondary | ICD-10-CM | POA: Diagnosis present

## 2012-07-23 LAB — CBC
Hemoglobin: 12.7 g/dL (ref 12.0–15.0)
MCH: 27.6 pg (ref 26.0–34.0)
MCV: 84.3 fL (ref 78.0–100.0)
Platelets: 165 10*3/uL (ref 150–400)
RBC: 4.6 MIL/uL (ref 3.87–5.11)
WBC: 9.4 10*3/uL (ref 4.0–10.5)

## 2012-07-23 SURGERY — Surgical Case
Anesthesia: Spinal | Site: Abdomen | Wound class: Clean Contaminated

## 2012-07-23 MED ORDER — EPHEDRINE SULFATE 50 MG/ML IJ SOLN
INTRAMUSCULAR | Status: DC | PRN
  Administered 2012-07-23: 10 mg via INTRAVENOUS

## 2012-07-23 MED ORDER — SODIUM CHLORIDE 0.9 % IJ SOLN: 3.0000 mL | INTRAMUSCULAR | Status: DC | PRN

## 2012-07-23 MED ORDER — SODIUM CHLORIDE 0.9 % IJ SOLN: 3.0000 mL | Freq: Two times a day (BID) | INTRAMUSCULAR | Status: DC

## 2012-07-23 MED ORDER — LACTATED RINGERS IV SOLN
INTRAVENOUS | Status: DC | PRN
  Administered 2012-07-23: 21:00:00 via INTRAVENOUS

## 2012-07-23 MED ORDER — PHENYLEPHRINE 40 MCG/ML (10ML) SYRINGE FOR IV PUSH (FOR BLOOD PRESSURE SUPPORT): 80.0000 ug | PREFILLED_SYRINGE | INTRAVENOUS | Status: DC | PRN

## 2012-07-23 MED ORDER — TERBUTALINE SULFATE 1 MG/ML IJ SOLN: 0.2500 mg | Freq: Once | INTRAMUSCULAR | Status: DC | PRN

## 2012-07-23 MED ORDER — MORPHINE SULFATE 0.5 MG/ML IJ SOLN
INTRAMUSCULAR | Status: AC
  Filled 2012-07-23: qty 10

## 2012-07-23 MED ORDER — KETOROLAC TROMETHAMINE 60 MG/2ML IM SOLN
INTRAMUSCULAR | Status: AC
  Administered 2012-07-23: 60 mg via INTRAMUSCULAR
  Filled 2012-07-23: qty 2

## 2012-07-23 MED ORDER — FLEET ENEMA 7-19 GM/118ML RE ENEM: 1.0000 | ENEMA | RECTAL | Status: DC | PRN

## 2012-07-23 MED ORDER — OXYCODONE-ACETAMINOPHEN 5-325 MG PO TABS: 1.0000 | ORAL_TABLET | ORAL | Status: DC | PRN

## 2012-07-23 MED ORDER — LACTATED RINGERS IV SOLN
INTRAVENOUS | Status: DC
  Administered 2012-07-23: 150 mL via INTRAVENOUS
  Administered 2012-07-23: 1000 mL via INTRAVENOUS
  Administered 2012-07-23: 14:00:00 via INTRAVENOUS

## 2012-07-23 MED ORDER — OXYTOCIN 40 UNITS IN LACTATED RINGERS INFUSION - SIMPLE MED: 62.5000 mL/h | Freq: Once | INTRAVENOUS | Status: DC

## 2012-07-23 MED ORDER — OXYTOCIN 40 UNITS IN LACTATED RINGERS INFUSION - SIMPLE MED
1.0000 m[IU]/min | INTRAVENOUS | Status: DC
  Administered 2012-07-23: 3 m[IU]/min via INTRAVENOUS
  Administered 2012-07-23: 4 m[IU]/min via INTRAVENOUS
  Administered 2012-07-23: 1.333 m[IU]/min via INTRAVENOUS
  Filled 2012-07-23: qty 1000

## 2012-07-23 MED ORDER — KETOROLAC TROMETHAMINE 60 MG/2ML IM SOLN
60.0000 mg | Freq: Once | INTRAMUSCULAR | Status: AC | PRN
  Administered 2012-07-23: 60 mg via INTRAMUSCULAR

## 2012-07-23 MED ORDER — CEFAZOLIN SODIUM-DEXTROSE 2-3 GM-% IV SOLR
INTRAVENOUS | Status: AC
  Filled 2012-07-23: qty 50

## 2012-07-23 MED ORDER — PHENYLEPHRINE HCL 10 MG/ML IJ SOLN
INTRAMUSCULAR | Status: DC | PRN
  Administered 2012-07-23: 80 ug via INTRAVENOUS

## 2012-07-23 MED ORDER — SCOPOLAMINE 1 MG/3DAYS TD PT72
MEDICATED_PATCH | TRANSDERMAL | Status: AC
  Administered 2012-07-23: 1.5 mg via TOPICAL
  Filled 2012-07-23: qty 1

## 2012-07-23 MED ORDER — MEPERIDINE HCL 25 MG/ML IJ SOLN: 6.2500 mg | INTRAMUSCULAR | Status: DC | PRN

## 2012-07-23 MED ORDER — FENTANYL CITRATE 0.05 MG/ML IJ SOLN
INTRAMUSCULAR | Status: DC | PRN
  Administered 2012-07-23: 85 ug via INTRAVENOUS
  Administered 2012-07-23: 15 ug via INTRATHECAL

## 2012-07-23 MED ORDER — FENTANYL CITRATE 0.05 MG/ML IJ SOLN: 25.0000 ug | INTRAMUSCULAR | Status: DC | PRN

## 2012-07-23 MED ORDER — FENTANYL 2.5 MCG/ML BUPIVACAINE 1/10 % EPIDURAL INFUSION (WH - ANES): 14.0000 mL/h | INTRAMUSCULAR | Status: DC

## 2012-07-23 MED ORDER — ONDANSETRON HCL 4 MG/2ML IJ SOLN: 4.0000 mg | Freq: Four times a day (QID) | INTRAMUSCULAR | Status: DC | PRN

## 2012-07-23 MED ORDER — CITRIC ACID-SODIUM CITRATE 334-500 MG/5ML PO SOLN
30.0000 mL | ORAL | Status: DC | PRN
  Administered 2012-07-23: 30 mL via ORAL
  Filled 2012-07-23: qty 15

## 2012-07-23 MED ORDER — OXYTOCIN 10 UNIT/ML IJ SOLN
INTRAMUSCULAR | Status: AC
  Filled 2012-07-23: qty 4

## 2012-07-23 MED ORDER — ONDANSETRON HCL 4 MG/2ML IJ SOLN
INTRAMUSCULAR | Status: DC | PRN
  Administered 2012-07-23: 4 mg via INTRAVENOUS

## 2012-07-23 MED ORDER — ACETAMINOPHEN 325 MG PO TABS: 650.0000 mg | ORAL_TABLET | ORAL | Status: DC | PRN

## 2012-07-23 MED ORDER — EPHEDRINE 5 MG/ML INJ: 10.0000 mg | INTRAVENOUS | Status: DC | PRN

## 2012-07-23 MED ORDER — IBUPROFEN 600 MG PO TABS: 600.0000 mg | ORAL_TABLET | Freq: Four times a day (QID) | ORAL | Status: DC | PRN

## 2012-07-23 MED ORDER — ONDANSETRON HCL 4 MG/2ML IJ SOLN
INTRAMUSCULAR | Status: AC
  Filled 2012-07-23: qty 2

## 2012-07-23 MED ORDER — NALBUPHINE SYRINGE 5 MG/0.5 ML
INJECTION | INTRAMUSCULAR | Status: AC
  Administered 2012-07-23: 10 mg via INTRAVENOUS
  Filled 2012-07-23: qty 1

## 2012-07-23 MED ORDER — LACTATED RINGERS IV SOLN: 500.0000 mL | Freq: Once | INTRAVENOUS | Status: DC

## 2012-07-23 MED ORDER — MORPHINE SULFATE (PF) 0.5 MG/ML IJ SOLN
INTRAMUSCULAR | Status: DC | PRN
  Administered 2012-07-23: 4.9 mg via INTRAVENOUS
  Administered 2012-07-23: .1 mg via INTRATHECAL

## 2012-07-23 MED ORDER — OXYTOCIN 10 UNIT/ML IJ SOLN
40.0000 [IU] | INTRAVENOUS | Status: DC | PRN
  Administered 2012-07-23: 40 [IU] via INTRAVENOUS

## 2012-07-23 MED ORDER — LIDOCAINE HCL (PF) 1 % IJ SOLN: 30.0000 mL | INTRAMUSCULAR | Status: DC | PRN

## 2012-07-23 MED ORDER — BUPIVACAINE IN DEXTROSE 0.75-8.25 % IT SOLN
INTRATHECAL | Status: DC | PRN
  Administered 2012-07-23: 9.75 mg via INTRATHECAL

## 2012-07-23 MED ORDER — NALBUPHINE HCL 10 MG/ML IJ SOLN
10.0000 mg | INTRAMUSCULAR | Status: DC | PRN
  Administered 2012-07-23 (×2): 10 mg via INTRAVENOUS
  Filled 2012-07-23: qty 1

## 2012-07-23 MED ORDER — FENTANYL CITRATE 0.05 MG/ML IJ SOLN
INTRAMUSCULAR | Status: AC
  Filled 2012-07-23: qty 2

## 2012-07-23 MED ORDER — EPHEDRINE 5 MG/ML INJ
INTRAVENOUS | Status: AC
  Filled 2012-07-23: qty 10

## 2012-07-23 MED ORDER — DIPHENHYDRAMINE HCL 50 MG/ML IJ SOLN: 12.5000 mg | INTRAMUSCULAR | Status: DC | PRN

## 2012-07-23 MED ORDER — PHENYLEPHRINE 40 MCG/ML (10ML) SYRINGE FOR IV PUSH (FOR BLOOD PRESSURE SUPPORT)
PREFILLED_SYRINGE | INTRAVENOUS | Status: AC
  Filled 2012-07-23: qty 5

## 2012-07-23 MED ORDER — SODIUM CHLORIDE 0.9 % IV SOLN: 250.0000 mL | INTRAVENOUS | Status: DC | PRN

## 2012-07-23 MED ORDER — LACTATED RINGERS IV SOLN: 500.0000 mL | INTRAVENOUS | Status: DC | PRN

## 2012-07-23 MED ORDER — OXYTOCIN BOLUS FROM INFUSION
250.0000 mL | Freq: Once | INTRAVENOUS | Status: DC
  Filled 2012-07-23: qty 500

## 2012-07-23 MED ORDER — CEFAZOLIN SODIUM 1-5 GM-% IV SOLN
INTRAVENOUS | Status: DC | PRN
  Administered 2012-07-23: 2 g via INTRAVENOUS

## 2012-07-23 SURGICAL SUPPLY — 31 items
BENZOIN TINCTURE PRP APPL 2/3 (GAUZE/BANDAGES/DRESSINGS) IMPLANT
CLOTH BEACON ORANGE TIMEOUT ST (SAFETY) ×2 IMPLANT
DRSG COVADERM 4X10 (GAUZE/BANDAGES/DRESSINGS) ×2 IMPLANT
ELECT REM PT RETURN 9FT ADLT (ELECTROSURGICAL) ×2
ELECTRODE REM PT RTRN 9FT ADLT (ELECTROSURGICAL) ×1 IMPLANT
EXTRACTOR VACUUM KIWI (MISCELLANEOUS) ×2 IMPLANT
GLOVE BIO SURGEON ST LM GN SZ9 (GLOVE) ×2 IMPLANT
GLOVE BIOGEL PI IND STRL 9 (GLOVE) ×2 IMPLANT
GLOVE BIOGEL PI INDICATOR 9 (GLOVE) ×2
GOWN PREVENTION PLUS LG XLONG (DISPOSABLE) ×2 IMPLANT
GOWN PREVENTION PLUS XLARGE (GOWN DISPOSABLE) ×2 IMPLANT
GOWN STRL REIN 3XL LVL4 (GOWN DISPOSABLE) ×2 IMPLANT
NEEDLE HYPO 25X5/8 SAFETYGLIDE (NEEDLE) IMPLANT
NS IRRIG 1000ML POUR BTL (IV SOLUTION) ×2 IMPLANT
PACK C SECTION WH (CUSTOM PROCEDURE TRAY) ×2 IMPLANT
PAD ABD 7.5X8 STRL (GAUZE/BANDAGES/DRESSINGS) ×2 IMPLANT
RETRACTOR WND ALEXIS 25 LRG (MISCELLANEOUS) IMPLANT
RTRCTR C-SECT PINK 25CM LRG (MISCELLANEOUS) IMPLANT
RTRCTR WOUND ALEXIS 25CM LRG (MISCELLANEOUS)
SLEEVE SCD COMPRESS KNEE MED (MISCELLANEOUS) ×2 IMPLANT
STRIP CLOSURE SKIN 1/2X4 (GAUZE/BANDAGES/DRESSINGS) IMPLANT
SUT CHROMIC 0 CTX 36 (SUTURE) ×4 IMPLANT
SUT VIC AB 0 CT1 27 (SUTURE) ×1
SUT VIC AB 0 CT1 27XBRD ANBCTR (SUTURE) ×1 IMPLANT
SUT VIC AB 2-0 CT1 27 (SUTURE) ×2
SUT VIC AB 2-0 CT1 TAPERPNT 27 (SUTURE) ×2 IMPLANT
SUT VIC AB 4-0 KS 27 (SUTURE) ×2 IMPLANT
SYR BULB IRRIGATION 50ML (SYRINGE) IMPLANT
TAPE CLOTH SURG 4X10 WHT LF (GAUZE/BANDAGES/DRESSINGS) ×2 IMPLANT
TRAY FOLEY CATH 14FR (SET/KITS/TRAYS/PACK) ×2 IMPLANT
WATER STERILE IRR 1000ML POUR (IV SOLUTION) IMPLANT

## 2012-07-23 NOTE — Anesthesia Procedure Notes (Signed)
Spinal  Patient location during procedure: OR Start time: 07/23/2012 8:49 PM Staffing Anesthesiologist: CASSIDY, AMY Performed by: anesthesiologist  Preanesthetic Checklist Completed: patient identified, site marked, surgical consent, pre-op evaluation, timeout performed, IV checked, risks and benefits discussed and monitors and equipment checked Spinal Block Patient position: sitting Prep: site prepped and draped and DuraPrep Patient monitoring: heart rate, cardiac monitor, continuous pulse ox and blood pressure Approach: midline Location: L3-4 Injection technique: single-shot Needle Needle type: Sprotte  Needle gauge: 24 G Needle length: 9 cm Assessment Sensory level: T4 Additional Notes Clear free flow CSF on first attempt.  No paresthesia.  Patient tolerated procedure well.  Jasmine December, MD

## 2012-07-23 NOTE — Transfer of Care (Signed)
Immediate Anesthesia Transfer of Care Note  Patient: Kayla Mclean  Procedure(s) Performed: Procedure(s) (LRB): CESAREAN SECTION (N/A)  Patient Location: PACU  Anesthesia Type: Spinal  Level of Consciousness: awake, alert  and oriented  Airway & Oxygen Therapy: Patient Spontanous Breathing  Post-op Assessment: Report given to PACU RN and Post -op Vital signs reviewed and stable  Post vital signs: Reviewed and stable  Complications: No apparent anesthesia complications

## 2012-07-23 NOTE — Op Note (Signed)
See the operative note details included in the brief of note

## 2012-07-23 NOTE — H&P (Signed)
Kayla Mclean is a 26 y.o. female presenting for induction of labor. Maternal Medical History:  Reason for admission: IOL for Cholestasis of Pregnancy.   Fetal activity: Perceived fetal activity is normal.   Last perceived fetal movement was within the past hour.    Prenatal complications: Cholestasis, Previous LTCS with VBAC of IUFD at 28 weeks    Pregnancy dated by [redacted]w[redacted]d ultrasound. OB History    Grav Para Term Preterm Abortions TAB SAB Ect Mult Living   3 2 1 1  0  0   1     Past Medical History  Diagnosis Date  . No pertinent past medical history   . Cholelithiasis    Past Surgical History  Procedure Date  . Cesarean section    Family History: family history is negative for Other. Social History:  reports that she has never smoked. She has never used smokeless tobacco. She reports that she does not drink alcohol or use illicit drugs.   Prenatal Transfer Tool  Maternal Diabetes: No Genetic Screening: Normal Maternal Ultrasounds/Referrals: Normal Fetal Ultrasounds or other Referrals:  Referred to Materal Fetal Medicine  Maternal Substance Abuse:  No Significant Maternal Medications:  Meds include: Other: see prenatal record Significant Maternal Lab Results:  Lab values include: Group B Strep negative, Other: see prenatal record Other Comments:  None  Review of Systems  All other systems reviewed and are negative.    Dilation: 1.5 Effacement (%): 50 Station: -3 Exam by:: CNM Ellamae Lybeck Blood pressure 132/89, pulse 87, temperature 98.8 F (37.1 C), temperature source Axillary, resp. rate 18, height 5' (1.524 m), weight 141 lb (63.957 kg), last menstrual period 10/27/2011. Maternal Exam:  Abdomen: Patient reports no abdominal tenderness. Surgical scars: low transverse.   Estimated fetal weight is 7#0.   Fetal presentation: vertex  Introitus: Normal vulva. Normal vagina.  Pelvis: adequate for delivery.   Cervix: Cervix evaluated by digital exam.    1-2cm/50/vtx/-3 Foley bulb placed intracervically and filled with 60cc LR.   Physical Exam  Constitutional: She is oriented to person, place, and time. She appears well-developed and well-nourished.  HENT:  Head: Normocephalic and atraumatic.  Neck: Normal range of motion. Neck supple.  Cardiovascular: Normal rate and regular rhythm.   Respiratory: Effort normal and breath sounds normal.  GI:       Gravid, non tender  Musculoskeletal: Normal range of motion.  Neurological: She is alert and oriented to person, place, and time. She has normal reflexes.  Skin: Skin is warm and dry.  Psychiatric: She has a normal mood and affect. Her behavior is normal. Judgment and thought content normal.    Prenatal labs: ABO, Rh: --/--/O POS, O POS (06/29 0330) Antibody: NEG (06/29 0330) Rubella: Immune (03/25 0000) RPR: Nonreactive (06/07 0000)  HBsAg: NEGATIVE (06/28 1910)  HIV: Non-reactive (03/25 0000)  GBS: Negative (07/29 0000)   Assessment/Plan: IOL at [redacted]w[redacted]d for Cholestasis of Pregnancy Cervical ripening with foley bulb secondary to low bishop score and TOLAC  Anitha Kreiser E. 07/23/2012, 7:47 AM

## 2012-07-23 NOTE — Progress Notes (Signed)
Dr. Emelda Fear at the bedside to discuss risks and benefits of cesarean section delivery. Pt states understanding. Informed consents signed.

## 2012-07-23 NOTE — Progress Notes (Signed)
Off

## 2012-07-23 NOTE — Progress Notes (Signed)
Kayla Mclean is a 26 y.o. G3P1101 at [redacted]w[redacted]d by ultrasound admitted for induction of labor due to cholestasis of pregnancy, with patient with prior history of cesarean for CPD, as baby wouldn't descend Patient has signed previous consent for tubal ligation. She has been examined by me at 6 pm and now at 8 pm and has made no progress. Cervix remains 5 cm 50%/ and higher than -3, presenting part remains unstable, ballottable, barely reachable with thorough exam effort. Contraction intensity has become sufficient that patient is requesting epidural. Patient understands that the labor is following the pattern of her prior labor pattern, and my evaluation is that potential for vaginal delivery is quite small. Cesarean section is recommended and readily accepted by patient, and consents signed. Will proceed to cesarean delivery and tubal ligation Subjective:   Objective: BP 114/85  Pulse 113  Temp 98.1 F (36.7 C) (Oral)  Resp 18  Ht 5' (1.524 m)  Wt 63.957 kg (141 lb)  BMI 27.54 kg/m2  LMP 10/27/2011      FHT:  FHR: 140's bpm, variability: moderate,  accelerations:  Present,  decelerations:  Absent UC:   regular, every 3 minutes SVE:   Dilation: 5 Effacement (%): 70 Station: Ballotable Exam by:: Dr. Gar Ponto  Labs: Lab Results  Component Value Date   WBC 9.4 07/23/2012   HGB 12.7 07/23/2012   HCT 38.8 07/23/2012   MCV 84.3 07/23/2012   PLT 165 07/23/2012    Assessment / Plan: Arrest of decent  Labor: proceeding to cesarean Preeclampsia:  not applicable Fetal Wellbeing:  Category I Pain Control:  spinal for cesarean I/D:   Anticipated MOD:  cesarean with tubal.  Kayla Mclean V 07/23/2012, 8:03 PM

## 2012-07-23 NOTE — Progress Notes (Signed)
Cervical Foley bulb fell out while getting up to bathroom. Md notified.

## 2012-07-23 NOTE — Anesthesia Postprocedure Evaluation (Signed)
Anesthesia Post Note  Patient: Kayla Mclean  Procedure(s) Performed: Procedure(s) (LRB): CESAREAN SECTION (N/A)  Anesthesia type: Spinal  Patient location: PACU  Post pain: Pain level controlled  Post assessment: Post-op Vital signs reviewed  Last Vitals:  Filed Vitals:   07/23/12 2245  BP: 116/70  Pulse: 106  Temp:   Resp: 25    Post vital signs: Reviewed  Level of consciousness: awake  Complications: No apparent anesthesia complications

## 2012-07-23 NOTE — Brief Op Note (Signed)
07/23/2012  9:52 PM  PATIENT:  Kayla Mclean  26 y.o. female  PRE-OPERATIVE DIAGNOSIS:  Failure to Progress, Desires Sterilization  POST-OPERATIVE DIAGNOSIS:  Desires SterilizationFailure to Progress Uterine adhesions PROCEDURE:  Procedure(s) (LRB): CESAREAN SECTION (N/A)  SURGEON:  Surgeon(s) and Role:    * Tilda Burrow, MD - Primary  PHYSICIAN ASSISTANT:   ASSISTANTS: none   ANESTHESIA:   spinal  EBL:  Total I/O In: 2900 [I.V.:2900] Out: 700 [Urine:100; Blood:600]  BLOOD ADMINISTERED:none  DRAINS: Urinary Catheter (Foley)   LOCAL MEDICATIONS USED:  NONE  SPECIMEN:  No Specimen  DISPOSITION OF SPECIMEN:  l & D  COUNTS:  YES  TOURNIQUET:  * No tourniquets in log *  DICTATION: .Dragon Dictation this was taken to the operating room, prepped and draped for lower abdominal surgery with Foley catheter inserted, Ancef administered IV 2 g, and timeout conducted with surgical team. The prior transverse abdominal incision was repeated in the method of Pfannenstiel. The midline was asymmetric, deviated slightly the patient's right. It was difficult of the peritoneal cavity, it was identified that the uterus was densely adherent to the anterior abdominal wall. Sufficient lower uterine segment could be identified with the bladder identified and pulled inferiorly so that transverse uterine incision could be made high on the lower uterine segment. The vertex was guided into the incision, and required Kiwi vacuum extractor do to the pressure and angles  required to expel the baby. It was clamped and baby passed to waiting pediatrician in good condition. Placenta delivered easily the thin adhesions to the anterior bowel wall of the fundus could be open sufficiently to identify the urine fundus is an extensive firm dense adhesion from the lower uterine segment to the left side of the midline lower abdomen.. The uterus was first closed with running locking first layer in continuous running  second layer dense adhesion was then addressed and isolated with finger behind it, and using Bovie cautery was separated from the anterior abdominal wall to allow for increased uterine mobility. A C-shaped defect in the surface of the lower uterine segment was oversewn with a running 0 chromic suture with adequate hemostasis..  Tubal ligation tubal ligation could then be performed by identifying first the left tube with some difficulty due to adhesions that remained, but tube could be identified to its fimbriated end and go she clip applied without difficulty the left side. On the right side the tube could be easily visualized and Filshie clip was applied easily. Anterior peritoneum was pulled over the midline, with some omentum drawn down to be between the uterus and the abdominal wall. Rectus muscles were loosely reapproximated with single suture of 0 chromic, then 0 Vicryl used to close the fascia. 3 interrupted horizontal mattress sutures of 2-0 Vicryl were used in the subcutaneous fatty tissue, and Keith needle 4-0 Vicryl used to close the skin EBL 700 cc sponge and needle counts correct.  PLAN OF CARE: Admit to inpatient   PATIENT DISPOSITION:  PACU - hemodynamically stable.   Delay start of Pharmacological VTE agent (>24hrs) due to surgical blood loss or risk of bleeding: not applicable

## 2012-07-23 NOTE — Progress Notes (Signed)
Lucendia Leard is a 26 y.o. G3P1101 at [redacted]w[redacted]d admitted for induction of labor due to cholestasis.  Subjective: Doing well with IV pain meds.  Contractions strong.  Objective: BP 126/81  Pulse 94  Temp 98.5 F (36.9 C) (Axillary)  Resp 20  Ht 5' (1.524 m)  Wt 141 lb (63.957 kg)  BMI 27.54 kg/m2  LMP 10/27/2011      FHT:  FHR: 135 bpm, variability: moderate,  accelerations:  Present,  decelerations:  Absent UC:   regular, every 2-5 minutes SVE:   Dilation: 5 Effacement (%): 70 Station: ballotable Exam by:: Dr. Adrian Blackwater  Labs: Lab Results  Component Value Date   WBC 9.4 07/23/2012   HGB 12.7 07/23/2012   HCT 38.8 07/23/2012   MCV 84.3 07/23/2012   PLT 165 07/23/2012    Assessment / Plan: Induction of labor due to cholestasis, s/p foley bulb  Labor: will start pitocin Fetal Wellbeing:  Category I Pain Control:  Fentanyl I/D:  n/a Anticipated MOD:  NSVD  BOOTH, Abbegail Matuska 07/23/2012, 3:17 PM

## 2012-07-23 NOTE — Progress Notes (Signed)
Kayla Mclean is a 26 y.o. G3P1101 at [redacted]w[redacted]d admitted for induction of labor due to cholestasis.  Subjective: Contractions every 5 minutes; strong.  Doing okay for now.  Objective: BP 132/89  Pulse 87  Temp 98.8 F (37.1 C) (Axillary)  Resp 18  Ht 5' (1.524 m)  Wt 141 lb (63.957 kg)  BMI 27.54 kg/m2  LMP 10/27/2011      FHT:  FHR: 140 bpm, variability: moderate,  accelerations:  Present,  decelerations:  Absent UC:   regular, every 5 minutes SVE:   Dilation: 1.5 Effacement (%): 50 Station: -3 Exam by:: CNM [place]  Labs: Lab Results  Component Value Date   WBC 9.4 07/23/2012   HGB 12.7 07/23/2012   HCT 38.8 07/23/2012   MCV 84.3 07/23/2012   PLT 165 07/23/2012    Assessment / Plan: Induction of labor due to cholestasis,  Foley bulb in place  Labor: foley in place Fetal Wellbeing:  Category I, continue intermittent monitoring Pain Control:  Labor support without medications I/D:  n/a Anticipated MOD:  NSVD  BOOTH, Anis Cinelli 07/23/2012, 11:13 AM

## 2012-07-23 NOTE — Anesthesia Preprocedure Evaluation (Signed)
Anesthesia Evaluation  Patient identified by MRN, date of birth, ID band Patient awake    Reviewed: Allergy & Precautions, H&P , NPO status , Patient's Chart, lab work & pertinent test results, reviewed documented beta blocker date and time   History of Anesthesia Complications Negative for: history of anesthetic complications  Airway Mallampati: II TM Distance: >3 FB Neck ROM: full    Dental  (+) Teeth Intact   Pulmonary neg pulmonary ROS,  breath sounds clear to auscultation        Cardiovascular negative cardio ROS  Rhythm:regular Rate:Normal     Neuro/Psych negative neurological ROS  negative psych ROS   GI/Hepatic negative GI ROS, Cholestasis of pregnancy, elevated liver enzymes   Endo/Other  negative endocrine ROS  Renal/GU negative Renal ROS  negative genitourinary   Musculoskeletal   Abdominal   Peds  Hematology negative hematology ROS (+)   Anesthesia Other Findings   Reproductive/Obstetrics (+) Pregnancy (h/o prior c/s, failed VBAC)                           Anesthesia Physical Anesthesia Plan  ASA: II  Anesthesia Plan: Spinal   Post-op Pain Management:    Induction:   Airway Management Planned:   Additional Equipment:   Intra-op Plan:   Post-operative Plan:   Informed Consent: I have reviewed the patients History and Physical, chart, labs and discussed the procedure including the risks, benefits and alternatives for the proposed anesthesia with the patient or authorized representative who has indicated his/her understanding and acceptance.     Plan Discussed with: Surgeon and CRNA  Anesthesia Plan Comments:         Anesthesia Quick Evaluation

## 2012-07-23 NOTE — Progress Notes (Signed)
Off to sit on labor ball

## 2012-07-23 NOTE — Progress Notes (Signed)
EFM off to allow pt to walk, change positions per MD order. Will cont to monitor.

## 2012-07-24 ENCOUNTER — Encounter (HOSPITAL_COMMUNITY): Payer: Self-pay

## 2012-07-24 LAB — RPR: RPR Ser Ql: NONREACTIVE

## 2012-07-24 LAB — CBC
HCT: 31.5 % — ABNORMAL LOW (ref 36.0–46.0)
Hemoglobin: 10.5 g/dL — ABNORMAL LOW (ref 12.0–15.0)
MCV: 84.5 fL (ref 78.0–100.0)
RBC: 3.73 MIL/uL — ABNORMAL LOW (ref 3.87–5.11)
WBC: 15.3 10*3/uL — ABNORMAL HIGH (ref 4.0–10.5)

## 2012-07-24 MED ORDER — KETOROLAC TROMETHAMINE 30 MG/ML IJ SOLN: 30.0000 mg | Freq: Four times a day (QID) | INTRAMUSCULAR | Status: AC | PRN

## 2012-07-24 MED ORDER — OXYCODONE-ACETAMINOPHEN 5-325 MG PO TABS
1.0000 | ORAL_TABLET | ORAL | Status: DC | PRN
  Administered 2012-07-24: 2 via ORAL
  Administered 2012-07-24 – 2012-07-25 (×2): 1 via ORAL
  Filled 2012-07-24: qty 2
  Filled 2012-07-24 (×2): qty 1

## 2012-07-24 MED ORDER — TETANUS-DIPHTH-ACELL PERTUSSIS 5-2.5-18.5 LF-MCG/0.5 IM SUSP: 0.5000 mL | Freq: Once | INTRAMUSCULAR | Status: DC

## 2012-07-24 MED ORDER — DIBUCAINE 1 % RE OINT: 1.0000 "application " | TOPICAL_OINTMENT | RECTAL | Status: DC | PRN

## 2012-07-24 MED ORDER — PRENATAL MULTIVITAMIN CH
1.0000 | ORAL_TABLET | Freq: Every day | ORAL | Status: DC
  Administered 2012-07-24 – 2012-07-25 (×2): 1 via ORAL
  Filled 2012-07-24 (×2): qty 1

## 2012-07-24 MED ORDER — ONDANSETRON HCL 4 MG/2ML IJ SOLN: 4.0000 mg | INTRAMUSCULAR | Status: DC | PRN

## 2012-07-24 MED ORDER — ONDANSETRON HCL 4 MG/2ML IJ SOLN: 4.0000 mg | Freq: Three times a day (TID) | INTRAMUSCULAR | Status: DC | PRN

## 2012-07-24 MED ORDER — SODIUM CHLORIDE 0.9 % IJ SOLN: 9.0000 mL | INTRAMUSCULAR | Status: DC | PRN

## 2012-07-24 MED ORDER — ONDANSETRON HCL 4 MG PO TABS: 4.0000 mg | ORAL_TABLET | ORAL | Status: DC | PRN

## 2012-07-24 MED ORDER — IBUPROFEN 600 MG PO TABS: 600.0000 mg | ORAL_TABLET | Freq: Four times a day (QID) | ORAL | Status: DC | PRN

## 2012-07-24 MED ORDER — SIMETHICONE 80 MG PO CHEW
80.0000 mg | CHEWABLE_TABLET | Freq: Three times a day (TID) | ORAL | Status: DC
  Administered 2012-07-24 – 2012-07-25 (×5): 80 mg via ORAL

## 2012-07-24 MED ORDER — DIPHENHYDRAMINE HCL 50 MG/ML IJ SOLN: 12.5000 mg | INTRAMUSCULAR | Status: DC | PRN

## 2012-07-24 MED ORDER — METOCLOPRAMIDE HCL 5 MG/ML IJ SOLN: 10.0000 mg | Freq: Three times a day (TID) | INTRAMUSCULAR | Status: DC | PRN

## 2012-07-24 MED ORDER — SODIUM CHLORIDE 0.9 % IV SOLN
1.0000 ug/kg/h | INTRAVENOUS | Status: DC | PRN
  Filled 2012-07-24: qty 2.5

## 2012-07-24 MED ORDER — LANOLIN HYDROUS EX OINT: 1.0000 "application " | TOPICAL_OINTMENT | CUTANEOUS | Status: DC | PRN

## 2012-07-24 MED ORDER — WITCH HAZEL-GLYCERIN EX PADS: 1.0000 "application " | MEDICATED_PAD | CUTANEOUS | Status: DC | PRN

## 2012-07-24 MED ORDER — SODIUM CHLORIDE 0.9 % IJ SOLN: 3.0000 mL | INTRAMUSCULAR | Status: DC | PRN

## 2012-07-24 MED ORDER — DIPHENHYDRAMINE HCL 12.5 MG/5ML PO ELIX
12.5000 mg | ORAL_SOLUTION | Freq: Four times a day (QID) | ORAL | Status: DC | PRN
  Filled 2012-07-24: qty 5

## 2012-07-24 MED ORDER — NALOXONE HCL 0.4 MG/ML IJ SOLN: 0.4000 mg | INTRAMUSCULAR | Status: DC | PRN

## 2012-07-24 MED ORDER — OXYTOCIN 40 UNITS IN LACTATED RINGERS INFUSION - SIMPLE MED: 62.5000 mL/h | INTRAVENOUS | Status: AC

## 2012-07-24 MED ORDER — KETOROLAC TROMETHAMINE 30 MG/ML IJ SOLN
30.0000 mg | Freq: Four times a day (QID) | INTRAMUSCULAR | Status: AC | PRN
  Administered 2012-07-24: 30 mg via INTRAVENOUS
  Filled 2012-07-24: qty 1

## 2012-07-24 MED ORDER — SENNOSIDES-DOCUSATE SODIUM 8.6-50 MG PO TABS
2.0000 | ORAL_TABLET | Freq: Every day | ORAL | Status: DC
  Administered 2012-07-24: 2 via ORAL

## 2012-07-24 MED ORDER — DIPHENHYDRAMINE HCL 50 MG/ML IJ SOLN: 25.0000 mg | INTRAMUSCULAR | Status: DC | PRN

## 2012-07-24 MED ORDER — SCOPOLAMINE 1 MG/3DAYS TD PT72
1.0000 | MEDICATED_PATCH | Freq: Once | TRANSDERMAL | Status: DC
  Filled 2012-07-24: qty 1

## 2012-07-24 MED ORDER — NALBUPHINE SYRINGE 5 MG/0.5 ML
5.0000 mg | INJECTION | INTRAMUSCULAR | Status: DC | PRN
  Administered 2012-07-24: 10 mg via INTRAVENOUS
  Filled 2012-07-24 (×2): qty 1

## 2012-07-24 MED ORDER — IBUPROFEN 600 MG PO TABS
600.0000 mg | ORAL_TABLET | Freq: Four times a day (QID) | ORAL | Status: DC
  Administered 2012-07-24 – 2012-07-25 (×5): 600 mg via ORAL
  Filled 2012-07-24 (×5): qty 1

## 2012-07-24 MED ORDER — DIPHENHYDRAMINE HCL 50 MG/ML IJ SOLN: 12.5000 mg | Freq: Four times a day (QID) | INTRAMUSCULAR | Status: DC | PRN

## 2012-07-24 MED ORDER — DIPHENHYDRAMINE HCL 25 MG PO CAPS: 25.0000 mg | ORAL_CAPSULE | Freq: Four times a day (QID) | ORAL | Status: DC | PRN

## 2012-07-24 MED ORDER — HYDROMORPHONE 0.3 MG/ML IV SOLN: INTRAVENOUS | Status: DC

## 2012-07-24 MED ORDER — MENTHOL 3 MG MT LOZG: 1.0000 | LOZENGE | OROMUCOSAL | Status: DC | PRN

## 2012-07-24 MED ORDER — ONDANSETRON HCL 4 MG/2ML IJ SOLN: 4.0000 mg | Freq: Four times a day (QID) | INTRAMUSCULAR | Status: DC | PRN

## 2012-07-24 MED ORDER — ZOLPIDEM TARTRATE 5 MG PO TABS: 5.0000 mg | ORAL_TABLET | Freq: Every evening | ORAL | Status: DC | PRN

## 2012-07-24 MED ORDER — DIPHENHYDRAMINE HCL 25 MG PO CAPS: 25.0000 mg | ORAL_CAPSULE | ORAL | Status: DC | PRN

## 2012-07-24 MED ORDER — SIMETHICONE 80 MG PO CHEW: 80.0000 mg | CHEWABLE_TABLET | ORAL | Status: DC | PRN

## 2012-07-24 MED ORDER — LACTATED RINGERS IV SOLN: INTRAVENOUS | Status: DC

## 2012-07-24 MED ORDER — NALBUPHINE SYRINGE 5 MG/0.5 ML
5.0000 mg | INJECTION | INTRAMUSCULAR | Status: DC | PRN
  Filled 2012-07-24: qty 1

## 2012-07-24 NOTE — Progress Notes (Signed)
UR Chart review completed.  

## 2012-07-24 NOTE — Progress Notes (Signed)
Subjective: Postpartum Day 1: Cesarean Delivery Patient reports tolerating PO, clear liquids. Pain well controlled with prn meds.    Objective: Vital signs in last 24 hours: Temp:  [97.7 F (36.5 C)-99.5 F (37.5 C)] 98.9 F (37.2 C) (08/08 0545) Pulse Rate:  [89-113] 100  (08/08 0545) Resp:  [16-28] 18  (08/08 0545) BP: (114-136)/(66-86) 115/70 mmHg (08/08 0545) SpO2:  [95 %-100 %] 95 % (08/08 0545)  Physical Exam:  General: alert and cooperative CV: RRR, no murmur Resp: CTA bilaterally Abd: soft, non tender, BSx4 Lochia: appropriate Uterine Fundus: firm Incision: dressing c/d/i DVT Evaluation: No evidence of DVT seen on physical exam. Foley in place, output adequate    Basename 07/24/12 0538 07/23/12 0735  HGB 10.5* 12.7  HCT 31.5* 38.8    Assessment/Plan: Status post Cesarean section. Doing well postoperatively. Breastfeeding. Continue current care.  Lawernce Pitts 07/24/2012, 7:35 AM

## 2012-07-24 NOTE — Progress Notes (Signed)
I have seen and examined this patient and agree the above assessment. CRESENZO-DISHMAN,Karan Ramnauth 07/24/2012 7:44 AM

## 2012-07-25 LAB — TYPE AND SCREEN
ABO/RH(D): O POS
Antibody Screen: NEGATIVE
Unit division: 0

## 2012-07-25 MED ORDER — HYDROCODONE-ACETAMINOPHEN 5-500 MG PO TABS: 1.0000 | ORAL_TABLET | Freq: Four times a day (QID) | ORAL | Status: AC | PRN

## 2012-07-25 MED ORDER — IBUPROFEN 600 MG PO TABS: 600.0000 mg | ORAL_TABLET | Freq: Four times a day (QID) | ORAL | Status: AC | PRN

## 2012-07-25 NOTE — Discharge Summary (Signed)
Obstetric Discharge Summary Reason for Admission: induction of labor Prenatal Procedures: NST and ultrasound Intrapartum Procedures: Repeat Low Transverse C-Section for failed VBAC Postpartum Procedures: none Complications-Operative and Postpartum: none Hemoglobin  Date Value Range Status  07/24/2012 10.5* 12.0 - 15.0 g/dL Final     DELTA CHECK NOTED     REPEATED TO VERIFY  05/23/2012 12.0   Final     HCT  Date Value Range Status  07/24/2012 31.5* 36.0 - 46.0 % Final  05/23/2012 36   Final    Physical Exam:  General: alert, cooperative and no distress Lochia: appropriate Uterine Fundus: firm Incision: healing well, no significant drainage DVT Evaluation: No evidence of DVT seen on physical exam.  Discharge Diagnoses: RLTCS for failed VBAC after IOL at 37 weeks for cholestasis  Discharge Information: Date: 07/25/2012 Activity: pelvic rest Diet: routine Medications: Ibuprofen and Percocet Condition: stable Instructions: refer to practice specific booklet Discharge to: home Follow-up Information    Follow up with [facility]. Schedule an appointment as soon as possible for a visit in 2 weeks.   Contact information:   [address] [place] [address]          Newborn Data: Live born female  Birth Weight: 5 lb 15.9 oz (2720 g) APGAR: 8, 9  Home with mother.  Kayla Mclean E. 07/25/2012, 9:03 AM

## 2012-07-25 NOTE — Progress Notes (Signed)
Subjective: Postpartum Day 2: Cesarean Delivery Patient reports incisional pain, + flatus and no problems voiding. Pain is relieved with meds. Tolerating food w/o nausea or vomiting.  Objective: Vital signs in last 24 hours: Temp:  [97.3 F (36.3 C)-99 F (37.2 C)] 97.7 F (36.5 C) (08/09 0616) Pulse Rate:  [80-91] 87  (08/09 0616) Resp:  [16-18] 18  (08/09 0616) BP: (110-112)/(66-78) 110/78 mmHg (08/09 0616) SpO2:  [96 %-99 %] 99 % (08/08 1452)  Physical Exam:  General: alert and cooperative CV: RRR, no murmur Resp: CTA bilaterally ABD: soft, non-tender Lochia: appropriate Uterine Fundus: firm Incision: healing well, no significant drainage, no dehiscence, no significant erythema DVT Evaluation: No evidence of DVT seen on physical exam.   Basename 07/24/12 0538 07/23/12 0735  HGB 10.5* 12.7  HCT 31.5* 38.8    Assessment/Plan: Status post Cesarean section. Breastfeeding.  Doing well postoperatively.  Continue current care.  Kayla Mclean 07/25/2012, 7:22 AM

## 2012-07-25 NOTE — Progress Notes (Signed)
I have seen this patient and agree with the above student's note.    LEFTWICH-KIRBY, Horton Ellithorpe Certified Nurse-Midwife 

## 2012-07-29 NOTE — H&P (Signed)
Chart reviewed and agree with management and plan.  

## 2012-07-30 ENCOUNTER — Telehealth: Payer: Self-pay | Admitting: *Deleted

## 2012-07-30 DIAGNOSIS — O09299 Supervision of pregnancy with other poor reproductive or obstetric history, unspecified trimester: Secondary | ICD-10-CM

## 2012-07-30 DIAGNOSIS — O34219 Maternal care for unspecified type scar from previous cesarean delivery: Secondary | ICD-10-CM

## 2012-07-30 DIAGNOSIS — N736 Female pelvic peritoneal adhesions (postinfective): Secondary | ICD-10-CM

## 2012-07-30 DIAGNOSIS — Z302 Encounter for sterilization: Secondary | ICD-10-CM

## 2012-07-30 NOTE — Telephone Encounter (Signed)
Returned call to pt's husband.  He stated that he has received the FMLA papers in his mailbox after leaving the message.  He had no further questions.

## 2012-07-30 NOTE — Telephone Encounter (Signed)
Patients husband called to see if the papers he dropped off are completed.

## 2012-09-05 ENCOUNTER — Ambulatory Visit (INDEPENDENT_AMBULATORY_CARE_PROVIDER_SITE_OTHER): Payer: Medicaid Other | Admitting: Obstetrics and Gynecology

## 2012-09-05 ENCOUNTER — Encounter: Payer: Self-pay | Admitting: Obstetrics and Gynecology

## 2012-09-05 NOTE — Progress Notes (Signed)
  Subjective:     Kayla Mclean is a 26 y.o. female G3P2102 at 6 weeks post C-section and BTL who presents for a postpartum visit. She is 6 weeks postpartum following a low cervical transverse Cesarean section. I have fully reviewed the prenatal and intrapartum course. The delivery was at 37 gestational weeks. She had induction of labor due to cholestasis and maximum dilatation was 5 cm.Outcome: repeat cesarean section, low transverse incision and VBAC attempted. Anesthesia: epidural. Postpartum course has been uncomplicated. Baby's course has been uncomplicated. Baby is feeding by pumping breast milk due to latching on proble since she was in hospital.. Bleeding no bleeding. Bowel function is normal. Bladder function is normal. Patient is not sexually active. Contraception method is tubal ligation. Postpartum depression screening: negative.  The following portions of the patient's history were reviewed and updated as appropriate: allergies, current medications, past family history, past medical history, past social history, past surgical history and problem list.  Review of Systems Pertinent items are noted in HPI.   Objective:    BP 133/88  Pulse 78  Temp 97.2 F (36.2 C) (Oral)  Ht 5' (1.524 m)  Wt 129 lb 1.6 oz (58.559 kg)  BMI 25.21 kg/m2  Breastfeeding? Yes  General:  alert, cooperative and no distress   Breasts:  inspection negative, no nipple discharge or bleeding, no masses or nodularity palpable and lactational  Lungs: clear to auscultation bilaterally  Heart:  regular rate and rhythm  Abdomen: soft, non-tender; bowel sounds normal; no masses,  no organomegaly and 5 cm diastasis   Vulva:  not evaluated  Vagina: not evaluated   Cx: not evaluated   Corpus: not examined  Adnexa:  not evaluated  Rectal Exam: Not performed.        Assessment:    6 weeks postpartum exam. Pap smear not done at today's visit.   Plan:    1. Contraception: tubal ligation 2. Advised to make an  appointment with lactation consultant regarding pumping due to latching on problem. Advised to do abdominal tightening exercises 3. Follow up in: 1 year  or as needed.

## 2012-09-05 NOTE — Patient Instructions (Signed)

## 2013-07-02 ENCOUNTER — Encounter (HOSPITAL_COMMUNITY): Payer: Self-pay | Admitting: Emergency Medicine

## 2013-07-02 ENCOUNTER — Emergency Department (HOSPITAL_COMMUNITY)
Admission: EM | Admit: 2013-07-02 | Discharge: 2013-07-02 | Disposition: A | Discharge status: Home / Self Care | Payer: BC Managed Care – PPO | Source: Home / Self Care | Attending: Family Medicine | Admitting: Family Medicine

## 2013-07-02 DIAGNOSIS — K219 Gastro-esophageal reflux disease without esophagitis: Secondary | ICD-10-CM

## 2013-07-02 MED ORDER — RABEPRAZOLE SODIUM 20 MG PO TBEC: 20.0000 mg | DELAYED_RELEASE_TABLET | Freq: Every day | ORAL | Status: DC

## 2013-07-02 MED ORDER — RANITIDINE HCL 150 MG PO TABS: 150.0000 mg | ORAL_TABLET | Freq: Every day | ORAL | Status: DC

## 2013-07-02 MED ORDER — GI COCKTAIL ~~LOC~~
30.0000 mL | Freq: Once | ORAL | Status: AC
  Administered 2013-07-02: 30 mL via ORAL

## 2013-07-02 MED ORDER — GI COCKTAIL ~~LOC~~
ORAL | Status: AC
  Filled 2013-07-02: qty 30

## 2013-07-02 NOTE — ED Provider Notes (Signed)
History    CSN: [id number] Arrival date & time 07/02/13  1939  First MD Initiated Contact with Patient 07/02/13 2002     Chief Complaint  Patient presents with  . Abdominal Pain   (Consider location/radiation/quality/duration/timing/severity/associated sxs/prior Treatment) Patient is a 27 y.o. female presenting with abdominal pain. The history is provided by the patient.  Abdominal Pain This is a recurrent (75mo postpartum.) problem. The current episode started yesterday (h/o 3 mo pain off and on.relapsing sx since yest.). The problem has not changed since onset.Associated symptoms include abdominal pain.   Past Medical History  Diagnosis Date  . No pertinent past medical history   . Cholelithiasis    Past Surgical History  Procedure Laterality Date  . Cesarean section    . Cesarean section  07/23/2012    Procedure: CESAREAN SECTION;  Surgeon: Tilda Burrow, MD;  Location: [facility];  Service: Gynecology;  Laterality: N/A;  Bilateral Tubal Ligation   Family History  Problem Relation Age of Onset  . Other Neg Hx    History  Substance Use Topics  . Smoking status: Never Smoker   . Smokeless tobacco: Never Used  . Alcohol Use: No   OB History   Grav Para Term Preterm Abortions TAB SAB Ect Mult Living   3 3 2 1  0  0   2     Review of Systems  Constitutional: Negative.   Gastrointestinal: Positive for abdominal pain. Negative for nausea, vomiting, diarrhea and constipation.  Genitourinary: Negative.   Musculoskeletal: Negative for back pain.    Allergies  Review of patient's allergies indicates no known allergies.  Home Medications   Current Outpatient Rx  Name  Route  Sig  Dispense  Refill  . EXPIRED: omeprazole (PRILOSEC) 40 MG capsule   Oral   Take 1 capsule (40 mg total) by mouth daily.   30 capsule   0   . Prenatal Vit-Fe Fumarate-FA (PRENATAL MULTIVITAMIN) TABS   Oral   Take 1 tablet by mouth daily.         . RABEprazole (ACIPHEX) 20 MG tablet  Oral   Take 1 tablet (20 mg total) by mouth daily.   30 tablet   1   . ranitidine (ZANTAC) 150 MG tablet   Oral   Take 1 tablet (150 mg total) by mouth at bedtime.   30 tablet   1   . ursodiol (ACTIGALL) 300 MG capsule   Oral   Take 1 capsule (300 mg total) by mouth 3 (three) times daily.   60 capsule   0    BP 123/88  Pulse 74  Temp(Src) 98.1 F (36.7 C) (Oral)  Resp 20  SpO2 98%  LMP 05/30/2013  Breastfeeding? No Physical Exam  Nursing note and vitals reviewed. Constitutional: She is oriented to person, place, and time. She appears well-developed and well-nourished.  Pulmonary/Chest: Breath sounds normal.  Abdominal: Soft. Normal appearance and bowel sounds are normal. She exhibits no distension, no pulsatile liver, no pulsatile midline mass and no mass. There is no hepatosplenomegaly. There is tenderness in the epigastric area. There is no rigidity, no rebound, no guarding, no CVA tenderness, no tenderness at McBurney's point and negative Murphy's sign.    Neurological: She is alert and oriented to person, place, and time.    ED Course  Procedures (including critical care time) Labs Reviewed - No data to display No results found. 1. GERD (gastroesophageal reflux disease)     MDM    Fayrene Fearing  Sallyanne Kuster, MD 07/02/13 2030

## 2013-07-02 NOTE — ED Notes (Signed)
Pt c/o intermittent epigastric/chest pain onset 3 months... Was given omeprazole at one point w/no relief... Pain increases w/pressure and has noticed when she eats spicy/greesy foods pain also increases... Denies: f/v/d, constipation... She is alert w/no signs of acute distress.

## 2013-08-20 ENCOUNTER — Inpatient Hospital Stay (HOSPITAL_COMMUNITY): Payer: BC Managed Care – PPO

## 2013-08-20 ENCOUNTER — Encounter (HOSPITAL_COMMUNITY): Payer: Self-pay | Admitting: *Deleted

## 2013-08-20 ENCOUNTER — Observation Stay (HOSPITAL_COMMUNITY)
Admission: EM | Admit: 2013-08-20 | Discharge: 2013-08-21 | DRG: 297 | Disposition: A | Discharge status: Home / Self Care | Payer: BC Managed Care – PPO | Attending: Internal Medicine | Admitting: Internal Medicine

## 2013-08-20 DIAGNOSIS — I959 Hypotension, unspecified: Secondary | ICD-10-CM

## 2013-08-20 DIAGNOSIS — R519 Headache, unspecified: Secondary | ICD-10-CM

## 2013-08-20 DIAGNOSIS — R51 Headache: Secondary | ICD-10-CM | POA: Insufficient documentation

## 2013-08-20 DIAGNOSIS — R55 Syncope and collapse: Principal | ICD-10-CM

## 2013-08-20 LAB — CBC WITH DIFFERENTIAL/PLATELET
Basophils Absolute: 0 10*3/uL (ref 0.0–0.1)
Basophils Relative: 0 % (ref 0–1)
Eosinophils Absolute: 0.2 10*3/uL (ref 0.0–0.7)
Eosinophils Relative: 2 % (ref 0–5)
HCT: 38.2 % (ref 36.0–46.0)
MCH: 28.1 pg (ref 26.0–34.0)
MCHC: 33.8 g/dL (ref 30.0–36.0)
MCV: 83.2 fL (ref 78.0–100.0)
Monocytes Absolute: 0.7 10*3/uL (ref 0.1–1.0)
Platelets: 192 10*3/uL (ref 150–400)
RDW: 14.1 % (ref 11.5–15.5)
WBC: 7 10*3/uL (ref 4.0–10.5)

## 2013-08-20 LAB — BASIC METABOLIC PANEL
BUN: 9 mg/dL (ref 6–23)
CO2: 27 mEq/L (ref 19–32)
Calcium: 9.4 mg/dL (ref 8.4–10.5)
Chloride: 103 mEq/L (ref 96–112)
Creatinine, Ser: 0.59 mg/dL (ref 0.50–1.10)
Glucose, Bld: 84 mg/dL (ref 70–99)

## 2013-08-20 LAB — URINALYSIS, ROUTINE W REFLEX MICROSCOPIC
Bilirubin Urine: NEGATIVE
Ketones, ur: NEGATIVE mg/dL
Leukocytes, UA: NEGATIVE
Nitrite: NEGATIVE
Protein, ur: NEGATIVE mg/dL
pH: 6.5 (ref 5.0–8.0)

## 2013-08-20 LAB — POCT PREGNANCY, URINE: Preg Test, Ur: NEGATIVE

## 2013-08-20 LAB — D-DIMER, QUANTITATIVE: D-Dimer, Quant: <0.27 ug/mL-FEU (ref 0.00–0.48)

## 2013-08-20 MED ORDER — ONDANSETRON HCL 4 MG PO TABS: 4.0000 mg | ORAL_TABLET | Freq: Four times a day (QID) | ORAL | Status: DC | PRN

## 2013-08-20 MED ORDER — ENOXAPARIN SODIUM 40 MG/0.4ML ~~LOC~~ SOLN
40.0000 mg | SUBCUTANEOUS | Status: DC
  Filled 2013-08-20 (×2): qty 0.4

## 2013-08-20 MED ORDER — SODIUM CHLORIDE 0.9 % IV BOLUS (SEPSIS)
500.0000 mL | Freq: Once | INTRAVENOUS | Status: AC
  Administered 2013-08-20: 500 mL via INTRAVENOUS

## 2013-08-20 MED ORDER — HYDROCODONE-ACETAMINOPHEN 5-325 MG PO TABS: 1.0000 | ORAL_TABLET | ORAL | Status: DC | PRN

## 2013-08-20 MED ORDER — ALUM & MAG HYDROXIDE-SIMETH 200-200-20 MG/5ML PO SUSP
30.0000 mL | Freq: Four times a day (QID) | ORAL | Status: DC | PRN
  Administered 2013-08-20: 30 mL via ORAL
  Filled 2013-08-20: qty 30

## 2013-08-20 MED ORDER — SODIUM CHLORIDE 0.9 % IV SOLN
INTRAVENOUS | Status: DC
  Administered 2013-08-20 – 2013-08-21 (×3): via INTRAVENOUS

## 2013-08-20 MED ORDER — SODIUM CHLORIDE 0.9 % IV SOLN
Freq: Once | INTRAVENOUS | Status: AC
  Administered 2013-08-20: 11:00:00 via INTRAVENOUS

## 2013-08-20 MED ORDER — SODIUM CHLORIDE 0.9 % IJ SOLN: 3.0000 mL | Freq: Two times a day (BID) | INTRAMUSCULAR | Status: DC

## 2013-08-20 MED ORDER — SODIUM CHLORIDE 0.9 % IV SOLN
Freq: Once | INTRAVENOUS | Status: AC
  Administered 2013-08-20: 15:00:00 via INTRAVENOUS

## 2013-08-20 MED ORDER — ONDANSETRON HCL 4 MG/2ML IJ SOLN: 4.0000 mg | Freq: Four times a day (QID) | INTRAMUSCULAR | Status: DC | PRN

## 2013-08-20 MED ORDER — ACETAMINOPHEN 325 MG PO TABS: 650.0000 mg | ORAL_TABLET | Freq: Four times a day (QID) | ORAL | Status: DC | PRN

## 2013-08-20 MED ORDER — MORPHINE SULFATE 2 MG/ML IJ SOLN: 1.0000 mg | INTRAMUSCULAR | Status: DC | PRN

## 2013-08-20 MED ORDER — ACETAMINOPHEN 650 MG RE SUPP: 650.0000 mg | Freq: Four times a day (QID) | RECTAL | Status: DC | PRN

## 2013-08-20 NOTE — ED Provider Notes (Signed)
I saw and evaluated the patient, reviewed the resident's note and I agree with the findings and plan.   Dagmar Hait, MD 08/20/13 [phone]

## 2013-08-20 NOTE — ED Provider Notes (Signed)
CSN: [id number]     Arrival date & time 08/20/13  1014 History   First MD Initiated Contact with Patient 08/20/13 1015     Chief Complaint  Patient presents with  . Loss of Consciousness   HPI Comments: Pt is a 27 y/o female w/ no contributory PMHx who presents to the ED with near syncopal episode this AM when she was at work.  Pt was in her normal state of health up until this AM when she went to work.  She works at [employer] [place] the [employer] [place] with her husband, at which her shift started at 7 AM.  Pt woke up this AM feeling fine around 6 AM, went to work, had some water and coffee w/o breakfast, and started to move some boxes around at work when she became lightheaded and weak.  She grabbed onto a friend who was standing in front of her at that time and slowly lowered herself to the ground.  Her husband and another co-worker carried her back to the break room, laying her on the couch as pt was not able to ambulate to the break room.  EMS was called at that time and checked a CBG which was reported as nml, and she started to feel normal again on the way to the hospital.  Yesterday, pt went to work as well starting at 7 and finishing at 6, denies any alcohol/drug use at that time, any sick contacts, or recent travel.  At the time of the event, she denied palpitations, chest pain, shortness of breath, HA, tinnitus, nausea, vomiting, diarrhea.  She denies recent dysuria, hematuria, loss of blood or trauma to her head/body, myalgias, known insect bites, or a previous history of LOC, Migraines, seizures, palpations, family history of early cardiac death/seizures.   Patient is a 27 y.o. female presenting with syncope.  Loss of Consciousness Associated symptoms: no chest pain, no diaphoresis, no fever and no palpitations     Past Medical History  Diagnosis Date  . No pertinent past medical history   . Cholelithiasis    Past Surgical History  Procedure Laterality Date  . Cesarean section    .  Cesarean section  07/23/2012    Procedure: CESAREAN SECTION;  Surgeon: Tilda Burrow, MD;  Location: [facility];  Service: Gynecology;  Laterality: N/A;  Bilateral Tubal Ligation   Family History  Problem Relation Age of Onset  . Other Neg Hx    History  Substance Use Topics  . Smoking status: Never Smoker   . Smokeless tobacco: Never Used  . Alcohol Use: No   OB History   Grav Para Term Preterm Abortions TAB SAB Ect Mult Living   3 3 2 1  0  0   2     Review of Systems  Constitutional: Positive for fatigue. Negative for fever, diaphoresis and appetite change.  HENT: Negative for nosebleeds, sore throat, neck pain and neck stiffness.   Eyes: Positive for visual disturbance. Negative for photophobia, pain, discharge, redness and itching.  Respiratory: Negative.   Cardiovascular: Positive for syncope. Negative for chest pain, palpitations and leg swelling.  Gastrointestinal: Negative.   Endocrine: Negative.   Genitourinary: Negative.   Musculoskeletal: Negative.   Skin: Negative.   Allergic/Immunologic: Negative.   Neurological: Negative.   Hematological: Negative.   Psychiatric/Behavioral: Negative.     Allergies  Review of patient's allergies indicates no known allergies.  Home Medications   No current outpatient prescriptions on file. There were no vitals  taken for this visit. Physical Exam  Constitutional: She is oriented to person, place, and time. She appears well-developed and well-nourished. She appears lethargic. No distress.  HENT:  Head: Normocephalic and atraumatic.  Mouth/Throat: Mucous membranes are dry. No oropharyngeal exudate.  Eyes: Conjunctivae and EOM are normal. Pupils are equal, round, and reactive to light. No scleral icterus.  Neck: Normal range of motion. Neck supple.  Cardiovascular: Normal rate and regular rhythm.   No murmur heard. Pulmonary/Chest: Effort normal and breath sounds normal.  Abdominal: Soft. She exhibits no distension and no mass.  There is no tenderness. There is no rebound and no guarding.  Musculoskeletal: Normal range of motion.  Lymphadenopathy:    She has no cervical adenopathy.  Neurological: She is oriented to person, place, and time. She has normal strength and normal reflexes. She appears lethargic. No cranial nerve deficit or sensory deficit. Coordination normal. GCS eye subscore is 4. GCS verbal subscore is 5. GCS motor subscore is 6.  Skin: Skin is warm, dry and intact. No bruising and no rash noted.  Psychiatric: She has a normal mood and affect. Her speech is normal.    ED Course  Procedures (including critical care time)  Date: 08/20/2013  Rate: 70  Rhythm: normal sinus rhythm  QRS Axis: normal  Intervals: normal  ST/T Wave abnormalities: normal  Conduction Disutrbances: none  Narrative Interpretation: Normal EKG   Old EKG Reviewed: No significant changes noted    Labs Review Labs Reviewed  URINALYSIS, ROUTINE W REFLEX MICROSCOPIC - Abnormal; Notable for the following:    APPearance HAZY (*)    Specific Gravity, Urine 1.003 (*)    All other components within normal limits  BASIC METABOLIC PANEL  CBC WITH DIFFERENTIAL  POCT PREGNANCY, URINE   Imaging Review No results found.  MDM   1. Near syncope   Differential broad for near syncopal event, resolved upon arrival.  From H&P, sounds orthostatic vs vasovagal syncope.  However, need to r/o hypoglycemic event, seizure, arrhythmia, or central nervous process.  No fever, chills, cough, dysuria making infectious process less likely, will check UA/CBC.  Witnessed event with no seizure activity/bladder loss/tongue biting.  CBG checked in route was reported nml, will check BMET glucose.  Arrhythmia a concern, however, no palpitations, no family history, and no previous history of any LOC/murmur/palpitations, EKG NSR and will observe on monitor.  Central Nervous Process very unlikely as no focal deficits, no altered mental status, however possible  migraine like Sx.  In regards to her most likely differential, will get CBC to r/o anemia and give 1 L bolus and recheck.  If labs return nml, and 1 L bolus does not improve her condition, most likely a vasovagal event or severe dehydration.   1320 PM - Pt feeling slightly better after 1 L bolus.  Labwork to this point is completely nml and EKG nml w/o events on the monitor.  Most likely cause is vasovagal or orthostatic hypotension.   Will await UA and if negative, d/c home with close f/u with PCP.  Pt understands care plan and if she needs to come back to the ED.    1415 PM - Pt UA nml, still feeling lightheaded and dizzy.  Her BP when I went into see her despite 1 L bolus was 85/40.  I had her get up and walk and she was unsteady requiring help to ambulate up and down the hall.  Due to her unsteadiness and lack of response to fluid, will  admit.    1438 PM - Spoke with FM resident, Dr. Waynetta Sandy, who will admit the patient.  Bed order for telemetry placed and will give another 1 L bolus.   Twana First Paulina Fusi, DO of [facility] 08/20/2013, 2:24 PM    Briscoe Deutscher, DO 08/20/13 1439

## 2013-08-20 NOTE — ED Notes (Signed)
Pt recalls standing up and feeling dizzy.  Pt does not recall anything else until she woke up on the couch.  Pt was at work and co-worker states that pt "passed out" in front of her, pt was unconscious "a few minutes" and was confused when she first came to.  Pt is alert and oriented at this time.  VSS, EKG unremarkable for ems.  CBG was wnl.  Pt reports that she feels "tired and weak"

## 2013-08-20 NOTE — ED Notes (Signed)
Pt had 4mg  zofran IVP from ems

## 2013-08-20 NOTE — H&P (Signed)
[facility] History and Physical  Kayla Mclean ZOX:[id number] DOB: 1986-10-20 DOA: 08/20/2013  Referring physician: Dr Karleen Dolphin.  PCP: No PCP Per Patient  Specialists: none  Chief Complaint: Dizziness.   HPI: Kayla Mclean is a 27 y.o. female with no significant PMH who presents to ED after a syncope episode. She was in her usual state of health while at work when she became dizzy, lightheaded. She ask for help, because she was feeling generalize weakness. Her co-worker carried her back to the break room, laying her on the couch.    She was told that she pass out.  When she wake up she was confuse. She didn't know where she was at, she didn't understod her boss talking. She denies urinary or bowel incontinence, no tongue bite. She relates diaphoresis, and chest burning sensation. She has had headaches for the last month, usually in the morning. She also relates generalize body pain for last mont. She is also complaining of bilateral knee pain. She has at time chest burning pain, worse with meals. She takes PPI some times for this.  She denies cough, chest pain, diarrhea, abdominal pain, dysuria.   She was notice to have SBP in the 80 in the ED.   Review of Systems: negative except as per HPI.   Past Medical History  Diagnosis Date  . No pertinent past medical history   . Cholelithiasis    Past Surgical History  Procedure Laterality Date  . Cesarean section    . Cesarean section  07/23/2012    Procedure: CESAREAN SECTION;  Surgeon: Tilda Burrow, MD;  Location: [facility];  Service: Gynecology;  Laterality: N/A;  Bilateral Tubal Ligation   Social History:  reports that she has never smoked. She has never used smokeless tobacco. She reports that she does not drink alcohol or use illicit drugs.   No Known Allergies  Family History  Problem Relation Age of Onset  . Other Neg Hx   Father: history of Diabetes.   Prior to Admission medications   Not on File   Physical Exam: Filed  Vitals:   08/20/13 1500  BP: 103/47  Pulse: 80  Temp:   Resp: 18   General Appearance:    Alert, cooperative, no distress, appears stated age  Head:    Normocephalic, without obvious abnormality, atraumatic  Eyes:    PERRL, conjunctiva/corneas clear, EOM's intact,     Ears:    Normal TM's and external ear canals, both ears  Nose:   Nares normal, septum midline, mucosa normal, no drainage    or sinus tenderness  Throat:   Lips, mucosa, and tongue normal; teeth and gums normal  Neck:   Supple, symmetrical, trachea midline, no adenopathy;    thyroid:  no enlargement/tenderness/nodules; no carotid   bruit or JVD     Lungs:     Clear to auscultation bilaterally, respirations unlabored  Chest Wall:    No tenderness or deformity   Heart:    Regular rate and rhythm, S1 and S2 normal, no murmur, rub   or gallop     Abdomen:     Soft, non-tender, bowel sounds active all four quadrants,    no masses, no organomegaly        Extremities:   Extremities normal, atraumatic, no cyanosis or edema  Pulses:   2+ and symmetric all extremities  Skin:   Skin color, texture, turgor normal, no rashes or lesions  Lymph nodes:   Cervical, supraclavicular, and axillary nodes normal  Neurologic:   CNII-XII intact, normal strength, sensation and reflexes    throughout      Labs on Admission:  Basic Metabolic Panel:  Recent Labs Lab 08/20/13 1100  NA 138  K 4.1  CL 103  CO2 27  GLUCOSE 84  BUN 9  CREATININE 0.59  CALCIUM 9.4   Liver Function Tests: No results found for this basename: AST, ALT, ALKPHOS, BILITOT, PROT, ALBUMIN,  in the last 168 hours No results found for this basename: LIPASE, AMYLASE,  in the last 168 hours No results found for this basename: AMMONIA,  in the last 168 hours CBC:  Recent Labs Lab 08/20/13 1100  WBC 7.0  NEUTROABS 4.4  HGB 12.9  HCT 38.2  MCV 83.2  PLT 192   Cardiac Enzymes: No results found for this basename: CKTOTAL, CKMB, CKMBINDEX, TROPONINI,   in the last 168 hours  BNP (last 3 results) No results found for this basename: PROBNP,  in the last 8760 hours CBG: No results found for this basename: GLUCAP,  in the last 168 hours  Radiological Exams on Admission: No results found.  EKG: Independently reviewed. Sinus rhythm.   Assessment/Plan Principal Problem:   Hypotension Active Problems:   Syncope   Headache  1-Hypotension: Unclear etiology. This could be secondary to decrease volume. She doesn't appear to be septic. No leukocytosis. UA negative. Lactic acid pending. Chest x ray ordered. Will check cortisol level. She received 2 L IV fluids. I will give 500 cc bolus. Will continue with NS at 150 cc/hr. No history of diarrhea or vomiting.   2-Syncope: in setting of hypotension. IV fluids. Check d dimer although no tachycardia or significant chest pain.  3-Headaches: Will order CT head due to syncope episode and history of headache for 1 month.  4-Joint pain, Knee pain. Will check rheumatoid factor. Will check TSH.   Code Status: Full Code.  Family Communication: Care discussed with patient.  Disposition Plan: Admit inpatient , expect 2 days depending on labs results and condition improvement.   Time spent: 65 minutes.   [name] [facility] Pager [phone]  If 7PM-7AM, please contact night-coverage [email or web address] Password [email or web address] 08/20/2013, 4:22 PM

## 2013-08-20 NOTE — ED Notes (Signed)
Pt did not become dizzy when standing up for orthostatic VS.

## 2013-08-21 LAB — CBC
HCT: 34.8 % — ABNORMAL LOW (ref 36.0–46.0)
Hemoglobin: 11.4 g/dL — ABNORMAL LOW (ref 12.0–15.0)
MCH: 27.7 pg (ref 26.0–34.0)
MCHC: 32.8 g/dL (ref 30.0–36.0)
MCV: 84.5 fL (ref 78.0–100.0)
Platelets: 175 10*3/uL (ref 150–400)
RBC: 4.12 MIL/uL (ref 3.87–5.11)
RDW: 14.5 % (ref 11.5–15.5)
WBC: 5.8 10*3/uL (ref 4.0–10.5)

## 2013-08-21 LAB — COMPREHENSIVE METABOLIC PANEL
ALT: 15 U/L (ref 0–35)
Alkaline Phosphatase: 48 U/L (ref 39–117)
CO2: 21 mEq/L (ref 19–32)
Chloride: 108 mEq/L (ref 96–112)
GFR calc Af Amer: >90 mL/min (ref >90)
GFR calc non Af Amer: >90 mL/min (ref >90)
Glucose, Bld: 96 mg/dL (ref 70–99)
Potassium: 3.9 mEq/L (ref 3.5–5.1)
Sodium: 138 mEq/L (ref 135–145)
Total Bilirubin: 0.3 mg/dL (ref 0.3–1.2)
Total Protein: 6.2 g/dL (ref 6.0–8.3)

## 2013-08-21 LAB — TSH: TSH: 0.894 u[IU]/mL (ref 0.350–4.500)

## 2013-08-21 LAB — CORTISOL: Cortisol, Plasma: 1.5 ug/dL

## 2013-08-21 NOTE — Progress Notes (Signed)
Utilization review completed. Caelen Reierson, RN, BSN. 

## 2013-08-21 NOTE — Discharge Summary (Signed)
Physician Discharge Summary  Kayla Mclean ZOX:[id number] DOB: 02-16-1986 DOA: 08/20/2013  PCP: No PCP Per Patient  Admit date: 08/20/2013 Discharge date: 08/21/2013  Time spent: 30 minutes  Recommendations for Outpatient Follow-up:  1. Follow up with PCP in 1-2 weeks  Discharge Diagnoses:  Possible dehydration Principal Problem:   Hypotension Active Problems:   Syncope   Headache   Discharge Condition: Stable  Diet recommendation: Regular  Filed Weights   08/20/13 1817 08/21/13 0507  Weight: 56.8 kg (125 lb 3.5 oz) 58.786 kg (129 lb 9.6 oz)    History of present illness:  Kayla Mclean is a 27 y.o. female with no significant PMH who presents to ED after a syncope episode. She was in her usual state of health while at work when she became dizzy, lightheaded. She ask for help, because she was feeling generalize weakness. Her co-worker carried her back to the break room, laying her on the couch.  She was told that she pass out. When she wake up she was confuse. She didn't know where she was at, she didn't understod her boss talking. She denies urinary or bowel incontinence, no tongue bite. She relates diaphoresis, and chest burning sensation. She has had headaches for the last month, usually in the morning. She also relates generalize body pain for last mont. She is also complaining of bilateral knee pain. She has at time chest burning pain, worse with meals. She takes PPI some times for this.  She denies cough, chest pain, diarrhea, abdominal pain, dysuria.   Hospital Course:  The patient was admitted to the floor and continued on IVF overnight. She remaine medically stable without any acute event. Given hx of headaches, a CT head was done and found to be unremarkable. By the following AM, the patient reported feeling much improved.  Discharge Exam: Filed Vitals:   08/20/13 1740 08/20/13 1817 08/20/13 2151 08/21/13 0507  BP: 122/65  103/69 114/67  Pulse: 76  68 63  Temp: 98.7 F  (37.1 C)  98.2 F (36.8 C) 98.3 F (36.8 C)  TempSrc: Oral  Oral Oral  Resp: 18  18 20   Height:  4\' 11"  (1.499 m)    Weight:  56.8 kg (125 lb 3.5 oz)  58.786 kg (129 lb 9.6 oz)  SpO2: 98%  99% 100%    General: Awake, in nad Cardiovascular: regular, s1, s2 Respiratory: normal resp effort, no wheezing  Discharge Instructions     Medication List    Notice   You have not been prescribed any medications.     No Known Allergies Follow-up Information   Follow up with Follow up with your PCP in 1-2 weeks.      Follow up with [facility] EMERGENCY DEPARTMENT. (If symptoms worsen)    Specialty:  Emergency Medicine   Contact information:   [address] [id number] [place] [address] [phone]       The results of significant diagnostics from this hospitalization (including imaging, microbiology, ancillary and laboratory) are listed below for reference.    Significant Diagnostic Studies: Dg Chest 2 View  08/20/2013   *RADIOLOGY REPORT*  Clinical Data: Syncope  CHEST - 2 VIEW  Comparison: None  Findings: Upper-normal size of cardiac silhouette. Mediastinal contours and pulmonary vascularity normal. Lungs clear. No pleural effusion or pneumothorax. Bones unremarkable.  IMPRESSION: No acute abnormalities.   Original Report Authenticated By: Ulyses Southward, M.D.   Ct Head Wo Contrast  08/20/2013   *RADIOLOGY REPORT*  Clinical Data:  Syncope and dizziness; headache  CT HEAD WITHOUT CONTRAST  Technique:  Contiguous axial images were obtained from the base of the skull through the vertex without contrast. Study was obtained within 24 hours of patient arrival at the emergency department.  Comparison: None.  Findings: The ventricles are normal in size and configuration. There is no mass, hemorrhage, extra-axial fluid collection, or midline shift.  Gray-white compartments are normal.  No acute infarct apparent.  Bony calvarium appears intact.  The mastoid air cells  are clear.  IMPRESSION: Study within normal limits.   Original Report Authenticated By: Bretta Bang, M.D.    Microbiology: No results found for this or any previous visit (from the past 240 hour(s)).   Labs: Basic Metabolic Panel:  Recent Labs Lab 08/20/13 1100 08/21/13 0615  NA 138 138  K 4.1 3.9  CL 103 108  CO2 27 21  GLUCOSE 84 96  BUN 9 6  CREATININE 0.59 0.54  CALCIUM 9.4 8.5   Liver Function Tests:  Recent Labs Lab 08/21/13 0615  AST 22  ALT 15  ALKPHOS 48  BILITOT 0.3  PROT 6.2  ALBUMIN 3.2*   No results found for this basename: LIPASE, AMYLASE,  in the last 168 hours No results found for this basename: AMMONIA,  in the last 168 hours CBC:  Recent Labs Lab 08/20/13 1100 08/21/13 0615  WBC 7.0 5.8  NEUTROABS 4.4  --   HGB 12.9 11.4*  HCT 38.2 34.8*  MCV 83.2 84.5  PLT 192 175   Cardiac Enzymes: No results found for this basename: CKTOTAL, CKMB, CKMBINDEX, TROPONINI,  in the last 168 hours BNP: BNP (last 3 results) No results found for this basename: PROBNP,  in the last 8760 hours CBG: No results found for this basename: GLUCAP,  in the last 168 hours     Signed:  CHIU, STEPHEN K  [facility] 08/21/2013, 10:18 AM

## 2014-10-18 ENCOUNTER — Encounter (HOSPITAL_COMMUNITY): Payer: Self-pay | Admitting: *Deleted

## 2020-03-08 ENCOUNTER — Other Ambulatory Visit: Payer: Self-pay

## 2020-03-08 ENCOUNTER — Encounter (HOSPITAL_COMMUNITY): Payer: Self-pay

## 2020-03-08 ENCOUNTER — Ambulatory Visit (HOSPITAL_COMMUNITY)
Admission: EM | Admit: 2020-03-08 | Discharge: 2020-03-08 | Disposition: A | Discharge status: Home / Self Care | Payer: BLUE CROSS/BLUE SHIELD | Attending: Family Medicine | Admitting: Family Medicine

## 2020-03-08 DIAGNOSIS — K219 Gastro-esophageal reflux disease without esophagitis: Secondary | ICD-10-CM | POA: Diagnosis not present

## 2020-03-08 DIAGNOSIS — R5381 Other malaise: Secondary | ICD-10-CM

## 2020-03-08 DIAGNOSIS — Z20822 Contact with and (suspected) exposure to covid-19: Secondary | ICD-10-CM | POA: Insufficient documentation

## 2020-03-08 DIAGNOSIS — R059 Cough, unspecified: Secondary | ICD-10-CM

## 2020-03-08 DIAGNOSIS — B349 Viral infection, unspecified: Secondary | ICD-10-CM

## 2020-03-08 DIAGNOSIS — R52 Pain, unspecified: Secondary | ICD-10-CM

## 2020-03-08 DIAGNOSIS — R0789 Other chest pain: Secondary | ICD-10-CM | POA: Diagnosis not present

## 2020-03-08 DIAGNOSIS — R05 Cough: Secondary | ICD-10-CM | POA: Insufficient documentation

## 2020-03-08 DIAGNOSIS — R6884 Jaw pain: Secondary | ICD-10-CM | POA: Diagnosis not present

## 2020-03-08 DIAGNOSIS — R519 Headache, unspecified: Secondary | ICD-10-CM

## 2020-03-08 LAB — SARS CORONAVIRUS 2 (TAT 6-24 HRS): SARS Coronavirus 2: NEGATIVE

## 2020-03-08 MED ORDER — PROMETHAZINE-DM 6.25-15 MG/5ML PO SYRP: 5.0000 mL | ORAL_SOLUTION | Freq: Every evening | ORAL | 0 refills | Status: AC | PRN

## 2020-03-08 MED ORDER — FAMOTIDINE 20 MG PO TABS: 20.0000 mg | ORAL_TABLET | Freq: Two times a day (BID) | ORAL | 0 refills | Status: DC

## 2020-03-08 MED ORDER — BENZONATATE 100 MG PO CAPS: 100.0000 mg | ORAL_CAPSULE | Freq: Three times a day (TID) | ORAL | 0 refills | Status: AC | PRN

## 2020-03-08 NOTE — ED Provider Notes (Signed)
[place]   MRN: [id number] DOB: 03/01/1986  Subjective:   Kayla Mclean is a 34 y.o. female presenting for 2 to 3-day history of acute onset moderate malaise.  Patient spoke with a nurse who advised that she go to the emergency room to have MI ruled out.  Patient has no risk factors including negative history for diabetes, hypertension, dyslipidemia.  Patient is not a smoker.  Denies family history of heart disease, MI.  The concern was regarding feeling epigastric pain that moves upward into her chest.  Patient states that the symptoms have been more chronic in nature including a history of GERD. Takes Protonix for this.  Had an EGD last month and states that it was normal.  She did try Nexium last night and it helped.  Patient has also been coughing and thinks that this is a source of her chest pain as well.  Has bilateral jaw pain and clicking.  Tried Tylenol for the symptoms which did not help.  No Known Allergies  Past Medical History:  Diagnosis Date  . Cholelithiasis   . No pertinent past medical history      Past Surgical History:  Procedure Laterality Date  . CESAREAN SECTION    . CESAREAN SECTION  07/23/2012   Procedure: CESAREAN SECTION;  Surgeon: Jonnie Kind, MD;  Location: [facility];  Service: Gynecology;  Laterality: N/A;  Bilateral Tubal Ligation    Family History  Problem Relation Age of Onset  . Other Neg Hx     Social History   Tobacco Use  . Smoking status: Never Smoker  . Smokeless tobacco: Never Used  Substance Use Topics  . Alcohol use: No  . Drug use: No    Review of Systems  Constitutional: Positive for malaise/fatigue. Negative for fever.  HENT: Negative for congestion, ear pain, sinus pain and sore throat.   Eyes: Negative for discharge and redness.  Respiratory: Positive for cough. Negative for hemoptysis, shortness of breath and wheezing.   Cardiovascular: Positive for chest pain.  Gastrointestinal: Negative for abdominal pain,  diarrhea, nausea and vomiting.  Genitourinary: Negative for dysuria, flank pain and hematuria.  Musculoskeletal: Positive for myalgias.  Skin: Negative for rash.  Neurological: Positive for headaches. Negative for dizziness and weakness.  Psychiatric/Behavioral: Negative for depression and substance abuse.     Objective:   Vitals: BP 134/86 (BP Location: Left Arm)   Pulse 76   Temp 98 F (36.7 C) (Oral)   Resp 17   LMP 02/07/2020   SpO2 100%   Physical Exam Constitutional:      General: She is not in acute distress.    Appearance: Normal appearance. She is well-developed and normal weight. She is ill-appearing (Appears lethargic). She is not toxic-appearing or diaphoretic.  HENT:     Head: Normocephalic and atraumatic.     Right Ear: External ear normal.     Left Ear: External ear normal.     Nose: Nose normal.     Mouth/Throat:     Mouth: Mucous membranes are moist.     Pharynx: Oropharynx is clear.  Eyes:     General: No scleral icterus.    Extraocular Movements: Extraocular movements intact.     Pupils: Pupils are equal, round, and reactive to light.  Cardiovascular:     Rate and Rhythm: Normal rate and regular rhythm.     Pulses: Normal pulses.     Heart sounds: Normal heart sounds. No murmur. No friction rub. No gallop.  Pulmonary:     Effort: Pulmonary effort is normal. No respiratory distress.     Breath sounds: Normal breath sounds. No stridor. No wheezing, rhonchi or rales.  Abdominal:     General: Bowel sounds are normal. There is no distension.     Palpations: Abdomen is soft. There is no mass.     Tenderness: There is abdominal tenderness (epigastric area by report only, not elicited on exam). There is no right CVA tenderness, left CVA tenderness, guarding or rebound.  Musculoskeletal:     Cervical back: Normal range of motion and neck supple.  Lymphadenopathy:     Cervical: No cervical adenopathy.  Skin:    General: Skin is warm and dry.      Coloration: Skin is not pale.     Findings: No rash.  Neurological:     General: No focal deficit present.     Mental Status: She is alert and oriented to person, place, and time.     Cranial Nerves: No cranial nerve deficit.     Motor: No weakness.     Coordination: Coordination normal.     Gait: Gait normal.     Deep Tendon Reflexes: Reflexes normal.  Psychiatric:        Mood and Affect: Mood normal.        Behavior: Behavior normal.        Thought Content: Thought content normal.        Judgment: Judgment normal.     Assessment and Plan :   1. Viral syndrome   2. Body aches   3. Malaise and fatigue   4. Atypical chest pain   5. Generalized headache   6. Cough   7. Jaw pain   8. Gastroesophageal reflux disease without esophagitis     Recommended general supportive care for patient.  I have very low suspicion for MI, ACS given patient's lack of risk factors, normal vital signs and overall physical exam findings.  Discussed this with patient and recommended she try supportive care before going to the emergency room.  COVID-19 testing is pending.  Start Pepcid together with Protonix to help with breakthrough GERD, recommended dietary modifications. Counseled patient on potential for adverse effects with medications prescribed/recommended today, ER and return-to-clinic precautions discussed, patient verbalized understanding.    Wallis Bamberg, PA-C 03/08/20 1205

## 2020-03-08 NOTE — ED Triage Notes (Signed)
Pt presents with ongoing headache, generalized body aches, generalized chest soreness from coughing, and non productive cough since Sunday.

## 2020-03-08 NOTE — Discharge Instructions (Addendum)
We will manage this as a viral syndrome. For sore throat or cough try using a honey-based tea. Use 3 teaspoons of honey with juice squeezed from half lemon. Place shaved pieces of ginger into 1/2-1 cup of water and warm over stove top. Then mix the ingredients and repeat every 4 hours as needed. Please take Tylenol 500mg -650mg  every 6 hours for pains, headaches, fevers. Hydrate very well with at least 2 liters of water. Eat light meals such as soups to replenish electrolytes and soft fruits, veggies. Start an antihistamine like Zyrtec (cetirizine) at 10mg  daily for postnasal drainage, sinus congestion.  You can take this together with pseudoephedrine (Sudafed) at a dose of 60 mg 3 times a day or twice daily as needed for the same kind of congestion.   Limit your acid foods as described in the visit summary.

## 2020-03-09 ENCOUNTER — Telehealth (HOSPITAL_COMMUNITY): Payer: Self-pay

## 2020-03-09 NOTE — Telephone Encounter (Signed)
Patient contacted by phone and made aware of    results. Pt verbalized understanding and had all questions answered.  Your test for COVID-19 was negative.  Please continue good preventive care measures, including:  frequent hand-washing, avoid touching your face, cover coughs/sneezes, stay out of crowds and keep a 6 foot distance from others.    If you have any questions please call us at [phone].  

## 2020-03-10 ENCOUNTER — Emergency Department (HOSPITAL_BASED_OUTPATIENT_CLINIC_OR_DEPARTMENT_OTHER): Payer: BLUE CROSS/BLUE SHIELD

## 2020-03-10 ENCOUNTER — Other Ambulatory Visit: Payer: Self-pay

## 2020-03-10 ENCOUNTER — Encounter (HOSPITAL_BASED_OUTPATIENT_CLINIC_OR_DEPARTMENT_OTHER): Payer: Self-pay | Admitting: Emergency Medicine

## 2020-03-10 ENCOUNTER — Emergency Department (HOSPITAL_BASED_OUTPATIENT_CLINIC_OR_DEPARTMENT_OTHER)
Admission: EM | Admit: 2020-03-10 | Discharge: 2020-03-10 | Disposition: A | Discharge status: Home / Self Care | Payer: BLUE CROSS/BLUE SHIELD | Attending: Emergency Medicine | Admitting: Emergency Medicine

## 2020-03-10 DIAGNOSIS — R0789 Other chest pain: Secondary | ICD-10-CM | POA: Insufficient documentation

## 2020-03-10 DIAGNOSIS — R5383 Other fatigue: Secondary | ICD-10-CM | POA: Diagnosis not present

## 2020-03-10 DIAGNOSIS — R0602 Shortness of breath: Secondary | ICD-10-CM | POA: Diagnosis not present

## 2020-03-10 DIAGNOSIS — R519 Headache, unspecified: Secondary | ICD-10-CM | POA: Diagnosis not present

## 2020-03-10 DIAGNOSIS — R52 Pain, unspecified: Secondary | ICD-10-CM

## 2020-03-10 LAB — CBC WITH DIFFERENTIAL/PLATELET
Abs Immature Granulocytes: 0.01 10*3/uL (ref 0.00–0.07)
Basophils Absolute: 0 10*3/uL (ref 0.0–0.1)
Basophils Relative: 0 %
Eosinophils Absolute: 0.3 10*3/uL (ref 0.0–0.5)
Eosinophils Relative: 3 %
HCT: 40.4 % (ref 36.0–46.0)
Hemoglobin: 12.9 g/dL (ref 12.0–15.0)
Immature Granulocytes: 0 %
Lymphocytes Relative: 34 %
Lymphs Abs: 3.1 10*3/uL (ref 0.7–4.0)
MCH: 25.5 pg — ABNORMAL LOW (ref 26.0–34.0)
MCHC: 31.9 g/dL (ref 30.0–36.0)
MCV: 80 fL (ref 80.0–100.0)
Monocytes Absolute: 0.6 10*3/uL (ref 0.1–1.0)
Monocytes Relative: 7 %
Neutro Abs: 5.1 10*3/uL (ref 1.7–7.7)
Neutrophils Relative %: 56 %
Platelets: 270 10*3/uL (ref 150–400)
RBC: 5.05 MIL/uL (ref 3.87–5.11)
RDW: 18.8 % — ABNORMAL HIGH (ref 11.5–15.5)
WBC: 9.1 10*3/uL (ref 4.0–10.5)
nRBC: 0 % (ref 0.0–0.2)

## 2020-03-10 LAB — HEPATIC FUNCTION PANEL
ALT: 19 U/L (ref 0–44)
AST: 23 U/L (ref 15–41)
Albumin: 4.1 g/dL (ref 3.5–5.0)
Alkaline Phosphatase: 58 U/L (ref 38–126)
Bilirubin, Direct: <0.1 mg/dL (ref 0.0–0.2)
Total Bilirubin: 0.5 mg/dL (ref 0.3–1.2)
Total Protein: 8.2 g/dL — ABNORMAL HIGH (ref 6.5–8.1)

## 2020-03-10 LAB — BASIC METABOLIC PANEL
Anion gap: 9 (ref 5–15)
BUN: 10 mg/dL (ref 6–20)
CO2: 27 mmol/L (ref 22–32)
Calcium: 9.3 mg/dL (ref 8.9–10.3)
Chloride: 104 mmol/L (ref 98–111)
Creatinine, Ser: 0.84 mg/dL (ref 0.44–1.00)
GFR calc Af Amer: >60 mL/min (ref >60)
GFR calc non Af Amer: >60 mL/min (ref >60)
Glucose, Bld: 121 mg/dL — ABNORMAL HIGH (ref 70–99)
Potassium: 3.2 mmol/L — ABNORMAL LOW (ref 3.5–5.1)
Sodium: 140 mmol/L (ref 135–145)

## 2020-03-10 LAB — PREGNANCY, URINE: Preg Test, Ur: NEGATIVE

## 2020-03-10 LAB — TROPONIN I (HIGH SENSITIVITY): Troponin I (High Sensitivity): 2 ng/L (ref <18)

## 2020-03-10 LAB — D-DIMER, QUANTITATIVE (NOT AT ARMC): D-Dimer, Quant: 0.4 ug/mL-FEU (ref 0.00–0.50)

## 2020-03-10 MED ORDER — ACETAMINOPHEN 325 MG PO TABS
650.0000 mg | ORAL_TABLET | Freq: Once | ORAL | Status: AC
  Administered 2020-03-10: 22:00:00 650 mg via ORAL
  Filled 2020-03-10: qty 2

## 2020-03-10 NOTE — ED Triage Notes (Signed)
Pt arrives with multiple complaints - jaw pain/teeth grinding since Sunday, "massive" frontal headache, decreased energy "so tired"m, shoulders feeling heavy (worse in the left) x15 days, low back pain, right knee pain.

## 2020-03-10 NOTE — ED Provider Notes (Signed)
[facility] EMERGENCY DEPARTMENT Provider Note   CSN: [id number] Arrival date & time: 03/10/20  2050     History Chief Complaint  Patient presents with  . multiple complaints    Kayla Mclean is a 34 y.o. female.  Patient presents to the ED with multiple complaints.  States that she is having intermittent headache, chest wall pain, fatigue, malaise for the last several days.  Overall she states that symptoms come and go.  Some over-the-counter medications have helped.  Nothing makes it worse.  No cardiac risk factors.  shortness of breath, but no cough.  Denies any urinary symptoms.  No abdominal pain.  The history is provided by the patient.  Illness Severity:  Mild Onset quality:  Gradual Timing:  Intermittent Progression:  Waxing and waning Chronicity:  New Associated symptoms: chest pain, fatigue, headaches, nausea and shortness of breath   Associated symptoms: no abdominal pain, no cough, no ear pain, no fever, no rash, no sore throat and no vomiting        Past Medical History:  Diagnosis Date  . Cholelithiasis   . No pertinent past medical history     Patient Active Problem List   Diagnosis Date Noted  . Hypotension 08/20/2013  . Syncope 08/20/2013  . Headache 08/20/2013  . Cholestasis of pregnancy 07/03/2012  . Elevated liver enzymes 06/23/2012  . Supervision of high risk pregnancy in third trimester 06/23/2012    Past Surgical History:  Procedure Laterality Date  . CESAREAN SECTION    . CESAREAN SECTION  07/23/2012   Procedure: CESAREAN SECTION;  Surgeon: Tilda Burrow, MD;  Location: [facility];  Service: Gynecology;  Laterality: N/A;  Bilateral Tubal Ligation     OB History    Gravida  3   Para  3   Term  2   Preterm  1   AB  0   Living  2     SAB  0   TAB      Ectopic      Multiple      Live Births  3           Family History  Problem Relation Age of Onset  . Other Neg Hx     Social History   Tobacco Use  .  Smoking status: Never Smoker  . Smokeless tobacco: Never Used  Substance Use Topics  . Alcohol use: No  . Drug use: No    Home Medications Prior to Admission medications   Medication Sig Start Date End Date Taking? Authorizing Provider  benzonatate (TESSALON) 100 MG capsule Take 1-2 capsules (100-200 mg total) by mouth 3 (three) times daily as needed. 03/08/20   Wallis Bamberg, PA-C  famotidine (PEPCID) 20 MG tablet Take 1 tablet (20 mg total) by mouth 2 (two) times daily. 03/08/20   Wallis Bamberg, PA-C  promethazine-dextromethorphan (PROMETHAZINE-DM) 6.25-15 MG/5ML syrup Take 5 mLs by mouth at bedtime as needed for cough. 03/08/20   Wallis Bamberg, PA-C    Allergies    Patient has no known allergies.  Review of Systems   Review of Systems  Constitutional: Positive for fatigue. Negative for chills and fever.  HENT: Negative for ear pain and sore throat.   Eyes: Negative for pain and visual disturbance.  Respiratory: Positive for shortness of breath. Negative for cough.   Cardiovascular: Positive for chest pain. Negative for palpitations.  Gastrointestinal: Positive for nausea. Negative for abdominal pain and vomiting.  Genitourinary: Negative for dysuria and hematuria.  Musculoskeletal: Negative for arthralgias and back pain.  Skin: Negative for color change and rash.  Neurological: Positive for headaches. Negative for seizures and syncope.  All other systems reviewed and are negative.   Physical Exam Updated Vital Signs BP (!) 131/98   Pulse 85   Temp 97.7 F (36.5 C)   Resp 15   Ht 4\' 11"  (1.499 m)   Wt 66.2 kg   LMP 03/05/2020 (Approximate)   SpO2 100%   BMI 29.49 kg/m   Physical Exam Vitals and nursing note reviewed.  Constitutional:      General: She is not in acute distress.    Appearance: She is well-developed. She is not ill-appearing.  HENT:     Head: Normocephalic and atraumatic.     Nose: Nose normal.     Mouth/Throat:     Mouth: Mucous membranes are moist.    Eyes:     Extraocular Movements: Extraocular movements intact.     Conjunctiva/sclera: Conjunctivae normal.     Pupils: Pupils are equal, round, and reactive to light.  Cardiovascular:     Rate and Rhythm: Normal rate and regular rhythm.     Pulses: Normal pulses.     Heart sounds: Normal heart sounds. No murmur.  Pulmonary:     Effort: Pulmonary effort is normal. No respiratory distress.     Breath sounds: Normal breath sounds.  Abdominal:     Palpations: Abdomen is soft.     Tenderness: There is no abdominal tenderness.  Musculoskeletal:     Cervical back: Normal range of motion and neck supple.  Skin:    General: Skin is warm and dry.     Capillary Refill: Capillary refill takes less than 2 seconds.  Neurological:     General: No focal deficit present.     Mental Status: She is alert and oriented to person, place, and time.     Cranial Nerves: No cranial nerve deficit.     Sensory: No sensory deficit.     Motor: No weakness.     Coordination: Coordination normal.     Gait: Gait normal.     Comments: 5+ out of 5 strength throughout, normal sensation, no drift, normal speech, normal finger-nose-finger  Psychiatric:        Mood and Affect: Mood normal.     ED Results / Procedures / Treatments   Labs (all labs ordered are listed, but only abnormal results are displayed) Labs Reviewed  CBC WITH DIFFERENTIAL/PLATELET - Abnormal; Notable for the following components:      Result Value   MCH 25.5 (*)    RDW 18.8 (*)    All other components within normal limits  BASIC METABOLIC PANEL - Abnormal; Notable for the following components:   Potassium 3.2 (*)    Glucose, Bld 121 (*)    All other components within normal limits  HEPATIC FUNCTION PANEL - Abnormal; Notable for the following components:   Total Protein 8.2 (*)    All other components within normal limits  D-DIMER, QUANTITATIVE (NOT AT [facility])  PREGNANCY, URINE  POC URINE PREG, ED  TROPONIN I (HIGH SENSITIVITY)     EKG EKG Interpretation  Date/Time:  Thursday March 10 2020 21:12:41 EDT Ventricular Rate:  90 PR Interval:    QRS Duration: 96 QT Interval:  362 QTC Calculation: 443 R Axis:   54 Text Interpretation: Sinus rhythm Confirmed by [id number] [phone]) on 03/10/2020 10:34:11 PM   Radiology DG Chest 2 View  Result Date: 03/10/2020  CLINICAL DATA:  Pain EXAM: CHEST - 2 VIEW COMPARISON:  08/20/2013 FINDINGS: Heart and mediastinal contours are within normal limits. No focal opacities or effusions. No acute bony abnormality. IMPRESSION: No active cardiopulmonary disease. Electronically Signed   By: Rolm Baptise M.D.   On: 03/10/2020 22:19   CT Head Wo Contrast  Result Date: 03/10/2020 CLINICAL DATA:  Headache EXAM: CT HEAD WITHOUT CONTRAST TECHNIQUE: Contiguous axial images were obtained from the base of the skull through the vertex without intravenous contrast. COMPARISON:  08/20/2013 FINDINGS: Brain: No acute intracranial abnormality. Specifically, no hemorrhage, hydrocephalus, mass lesion, acute infarction, or significant intracranial injury. Vascular: No hyperdense vessel or unexpected calcification. Skull: No acute calvarial abnormality. Sinuses/Orbits: Visualized paranasal sinuses and mastoids clear. Orbital soft tissues unremarkable. Other: None IMPRESSION: Normal study. Electronically Signed   By: Rolm Baptise M.D.   On: 03/10/2020 22:19    Procedures Procedures (including critical care time)  Medications Ordered in ED Medications  acetaminophen (TYLENOL) tablet 650 mg (650 mg Oral Given 03/10/20 2227)    ED Course  I have reviewed the triage vital signs and the nursing notes.  Pertinent labs & imaging results that were available during my care of the patient were reviewed by me and considered in my medical decision making (see chart for details).    MDM Rules/Calculators/A&P                       Cyndie Woodbeck is a 34 year old female with no significant medical history who  presents to the ED with multiple complaints.  Patient first having some headaches.  Head CT was normal.  May be a migraine.  Improved with Tylenol.  No intracranial process.  No concern for stroke.  Patient with normal neurological exam.  She is also been having some shortness of breath, chest pain, chest wall tenderness.  Troponin was normal.  D-dimer normal.  Doubt ACS, doubt PE.  EKG shows sinus rhythm.  No ischemic changes.  Overall no significant anemia, electrolyte abnormality, kidney injury.  Kidney function unremarkable.  Pregnancy test negative.  Overall suspect anxiety/musculoskeletal type pain.  Recommend continued use of Tylenol Motrin.  Discharged from ED in good condition.  Recommend follow-up with primary care doctor.  This chart was dictated using voice recognition software.  Despite best efforts to proofread,  errors can occur which can change the documentation meaning.     Final Clinical Impression(s) / ED Diagnoses Final diagnoses:  Atypical chest pain    Rx / DC Orders ED Discharge Orders    None       Lennice Sites, DO 03/10/20 2310

## 2020-03-18 ENCOUNTER — Ambulatory Visit: Payer: BLUE CROSS/BLUE SHIELD | Attending: Internal Medicine

## 2020-03-18 DIAGNOSIS — Z23 Encounter for immunization: Secondary | ICD-10-CM

## 2020-03-18 NOTE — Progress Notes (Signed)
   Covid-19 Vaccination Clinic  Name:  Kayla Mclean    MRN: [id number] DOB: March 20, 1986  03/18/2020  [name] was observed post Covid-19 immunization for 15 minutes without incident. She was provided with Vaccine Information Sheet and instruction to access the V-Safe system.   [name] was instructed to call 911 with any severe reactions post vaccine: Marland Kitchen Difficulty breathing  . Swelling of face and throat  . A fast heartbeat  . A bad rash all over body  . Dizziness and weakness   Immunizations Administered    Name Date Dose VIS Date Route   Pfizer COVID-19 Vaccine 03/18/2020  8:45 AM 0.3 mL 11/27/2019 Intramuscular   Manufacturer: [employer], [employer]   Lot: [id number]   NDC: [address]-0

## 2020-04-11 ENCOUNTER — Ambulatory Visit: Payer: BLUE CROSS/BLUE SHIELD

## 2020-06-02 ENCOUNTER — Emergency Department (HOSPITAL_BASED_OUTPATIENT_CLINIC_OR_DEPARTMENT_OTHER)
Admission: EM | Admit: 2020-06-02 | Discharge: 2020-06-02 | Disposition: A | Discharge status: Home / Self Care | Payer: BLUE CROSS/BLUE SHIELD | Attending: Emergency Medicine | Admitting: Emergency Medicine

## 2020-06-02 ENCOUNTER — Emergency Department (HOSPITAL_BASED_OUTPATIENT_CLINIC_OR_DEPARTMENT_OTHER): Payer: BLUE CROSS/BLUE SHIELD

## 2020-06-02 ENCOUNTER — Encounter (HOSPITAL_BASED_OUTPATIENT_CLINIC_OR_DEPARTMENT_OTHER): Payer: Self-pay | Admitting: Emergency Medicine

## 2020-06-02 ENCOUNTER — Other Ambulatory Visit: Payer: Self-pay

## 2020-06-02 DIAGNOSIS — M26602 Left temporomandibular joint disorder, unspecified: Secondary | ICD-10-CM | POA: Diagnosis not present

## 2020-06-02 DIAGNOSIS — G8929 Other chronic pain: Secondary | ICD-10-CM | POA: Insufficient documentation

## 2020-06-02 DIAGNOSIS — R519 Headache, unspecified: Secondary | ICD-10-CM | POA: Insufficient documentation

## 2020-06-02 DIAGNOSIS — K219 Gastro-esophageal reflux disease without esophagitis: Secondary | ICD-10-CM | POA: Diagnosis not present

## 2020-06-02 DIAGNOSIS — M26609 Unspecified temporomandibular joint disorder, unspecified side: Secondary | ICD-10-CM

## 2020-06-02 DIAGNOSIS — R0789 Other chest pain: Secondary | ICD-10-CM

## 2020-06-02 DIAGNOSIS — M25512 Pain in left shoulder: Secondary | ICD-10-CM | POA: Insufficient documentation

## 2020-06-02 LAB — COMPREHENSIVE METABOLIC PANEL
ALT: 45 U/L — ABNORMAL HIGH (ref 0–44)
AST: 52 U/L — ABNORMAL HIGH (ref 15–41)
Albumin: 4.2 g/dL (ref 3.5–5.0)
Alkaline Phosphatase: 65 U/L (ref 38–126)
Anion gap: 10 (ref 5–15)
BUN: 9 mg/dL (ref 6–20)
CO2: 25 mmol/L (ref 22–32)
Calcium: 9.2 mg/dL (ref 8.9–10.3)
Chloride: 104 mmol/L (ref 98–111)
Creatinine, Ser: 0.7 mg/dL (ref 0.44–1.00)
GFR calc Af Amer: >60 mL/min (ref >60)
GFR calc non Af Amer: >60 mL/min (ref >60)
Glucose, Bld: 94 mg/dL (ref 70–99)
Potassium: 3.6 mmol/L (ref 3.5–5.1)
Sodium: 139 mmol/L (ref 135–145)
Total Bilirubin: 0.5 mg/dL (ref 0.3–1.2)
Total Protein: 8.2 g/dL — ABNORMAL HIGH (ref 6.5–8.1)

## 2020-06-02 LAB — CBC WITH DIFFERENTIAL/PLATELET
Abs Immature Granulocytes: 0.03 10*3/uL (ref 0.00–0.07)
Basophils Absolute: 0 10*3/uL (ref 0.0–0.1)
Basophils Relative: 0 %
Eosinophils Absolute: 0.1 10*3/uL (ref 0.0–0.5)
Eosinophils Relative: 1 %
HCT: 40.7 % (ref 36.0–46.0)
Hemoglobin: 12.9 g/dL (ref 12.0–15.0)
Immature Granulocytes: 0 %
Lymphocytes Relative: 15 %
Lymphs Abs: 1.8 10*3/uL (ref 0.7–4.0)
MCH: 26 pg (ref 26.0–34.0)
MCHC: 31.7 g/dL (ref 30.0–36.0)
MCV: 81.9 fL (ref 80.0–100.0)
Monocytes Absolute: 0.7 10*3/uL (ref 0.1–1.0)
Monocytes Relative: 6 %
Neutro Abs: 9.5 10*3/uL — ABNORMAL HIGH (ref 1.7–7.7)
Neutrophils Relative %: 78 %
Platelets: 243 10*3/uL (ref 150–400)
RBC: 4.97 MIL/uL (ref 3.87–5.11)
RDW: 15.1 % (ref 11.5–15.5)
WBC: 12.2 10*3/uL — ABNORMAL HIGH (ref 4.0–10.5)
nRBC: 0 % (ref 0.0–0.2)

## 2020-06-02 LAB — PREGNANCY, URINE: Preg Test, Ur: NEGATIVE

## 2020-06-02 LAB — D-DIMER, QUANTITATIVE: D-Dimer, Quant: 0.36 ug/mL-FEU (ref 0.00–0.50)

## 2020-06-02 LAB — TROPONIN I (HIGH SENSITIVITY): Troponin I (High Sensitivity): 2 ng/L (ref <18)

## 2020-06-02 MED ORDER — LIDOCAINE VISCOUS HCL 2 % MT SOLN
15.0000 mL | Freq: Once | OROMUCOSAL | Status: AC
  Administered 2020-06-02: 15 mL via ORAL
  Filled 2020-06-02: qty 15

## 2020-06-02 MED ORDER — FAMOTIDINE 20 MG PO TABS
20.0000 mg | ORAL_TABLET | Freq: Every day | ORAL | 0 refills | Status: AC | PRN
Start: 2020-06-02 — End: ?

## 2020-06-02 MED ORDER — ALUM & MAG HYDROXIDE-SIMETH 200-200-20 MG/5ML PO SUSP
30.0000 mL | Freq: Once | ORAL | Status: AC
  Administered 2020-06-02: 30 mL via ORAL
  Filled 2020-06-02: qty 30

## 2020-06-02 NOTE — Discharge Instructions (Addendum)
It was wonderful to meet you today!  For your jaw pain and headache, please start doing the exercises attached to this several times a day for the next several weeks.  Please increase your pantoprazole to 40 mg daily (from 20 mg) for your reflux.  If you have any breakthrough symptoms, you can take famotidine (Pepcid), I have sent this to your pharmacy.  You may also use Tylenol 650 mg every 4-6 hours to help with your jaw, chest, and shoulder pains.  Please use ice/heat on your shoulder and chest for 20 minutes at a time several times a day and avoid activities that make it worse.  Voltaren gel is a pain relief cream that could also be very helpful on your chest and shoulder.  From your labs: Your AST/ALT (markers of liver function) were just slightly above normal.  This is unlikely contributing to your current symptoms.  Please make sure you follow-up with your primary care provider to have these rechecked in the future.  Please make sure you follow-up with your primary care provider for further evaluation, you could discuss if an intra-articular injection in your shoulder would be appropriate at that time.  If you have worsening chest pain, shortness of breath, increasing vomiting with blood streaking please return to care.

## 2020-06-02 NOTE — ED Triage Notes (Addendum)
Chest burning, L shoulder and jaw pain, headache x 1 weeks. States she has also been spitting up blood

## 2020-06-02 NOTE — ED Provider Notes (Signed)
[facility] EMERGENCY DEPARTMENT Provider Note   CSN: [id number] Arrival date & time: 06/02/20  1608     History Chief Complaint  Patient presents with  . Chest Pain    Kayla Mclean is a 34 y.o. female with a history of GERD on pantoprazole 20mg  presenting for multiple complaints.  She reports new onset chest pain, left shoulder and jaw pain, headache and spitting up with a sour taste in her mouth.  She has been seen numerous times in the ED/urgent care due to atypical chest pain in the past.  Spitting up with sour taste for several days. Saw few drops of pinkish blood 2-3 times daily after several times spitting up since tuesday. Denies nausea, vomiting, or abdominal pain. Often bleeds from her gums. No cough, fever, SOB. No sick contacts. Takes pantoprazole only for GERD. She recently had a normal gastric emptying study and endoscopy in 2021.  Chest is central, feels like heaviness, worse when she squeezes her arms together. Constant in the background though. Feels worse after eating spicy foods and pain feels more burning in nature. Nexium helps, but comes back few hours later. Denies any new exercise regimen, not worse with activity. No associated nausea, diaphoresis, or SOB. No family history of early CAD, or personal diabetes, HTN, HLD.   Jaw pain bilaterally for past two days as well. Bilateral temporal headache. Feels tight. Constant. Unsure what makes it worse. Has some clicking with it.   Right shoulder pain for two months. Hurts superiorly/posteriorly.  Worse with overhead activities. No clicking, locking, or popping. Does not feel like its going pop out of socket. No associated weakness or numbness/tingling.      Past Medical History:  Diagnosis Date  . Cholelithiasis   . No pertinent past medical history     Patient Active Problem List   Diagnosis Date Noted  . Hypotension 08/20/2013  . Syncope 08/20/2013  . Headache 08/20/2013  . Cholestasis of pregnancy  07/03/2012  . Elevated liver enzymes 06/23/2012  . Supervision of high risk pregnancy in third trimester 06/23/2012    Past Surgical History:  Procedure Laterality Date  . CESAREAN SECTION    . CESAREAN SECTION  07/23/2012   Procedure: CESAREAN SECTION;  Surgeon: 09/22/2012, MD;  Location: [facility];  Service: Gynecology;  Laterality: N/A;  Bilateral Tubal Ligation     OB History    Gravida  3   Para  3   Term  2   Preterm  1   AB  0   Living  2     SAB  0   TAB      Ectopic      Multiple      Live Births  3           Family History  Problem Relation Age of Onset  . Other Neg Hx     Social History   Tobacco Use  . Smoking status: Never Smoker  . Smokeless tobacco: Never Used  Substance Use Topics  . Alcohol use: No  . Drug use: No    Home Medications Prior to Admission medications   Medication Sig Start Date End Date Taking? Authorizing Provider  benzonatate (TESSALON) 100 MG capsule Take 1-2 capsules (100-200 mg total) by mouth 3 (three) times daily as needed. 03/08/20   03/10/20, PA-C  famotidine (PEPCID) 20 MG tablet Take 1 tablet (20 mg total) by mouth daily as needed for heartburn or indigestion. 06/02/20  Allayne Stack, DO  promethazine-dextromethorphan (PROMETHAZINE-DM) 6.25-15 MG/5ML syrup Take 5 mLs by mouth at bedtime as needed for cough. 03/08/20   Wallis Bamberg, PA-C    Allergies    Patient has no known allergies.  Review of Systems   Review of Systems  Constitutional: Negative for chills, fatigue and fever.  HENT: Negative for mouth sores, nosebleeds, sinus pain and sore throat.   Respiratory: Positive for chest tightness. Negative for cough, shortness of breath and wheezing.   Cardiovascular: Positive for chest pain. Negative for palpitations and leg swelling.  Gastrointestinal: Negative for abdominal pain, constipation, diarrhea, nausea and vomiting.  Genitourinary: Negative for dysuria and hematuria.  Skin: Negative for  rash.  Neurological: Positive for headaches. Negative for dizziness, weakness and light-headedness.    Physical Exam Updated Vital Signs BP 104/69 (BP Location: Left Arm)   Pulse 72   Temp 98.8 F (37.1 C) (Oral)   Resp 16   Ht 4\' 11"  (1.499 m)   Wt 63.5 kg   LMP 05/06/2020   SpO2 99%   BMI 28.28 kg/m   Physical Exam Constitutional:      General: She is not in acute distress.    Appearance: She is well-developed.  HENT:     Head: Normocephalic and atraumatic.     Comments: Asymmetrical jaw opening with favoring to the right, click at TMJ with jaw opening.    Mouth/Throat:     Mouth: Mucous membranes are moist.     Comments: Oropharynx nonerythematous with nonenlarged tonsils.  No evidence of mouth sores or lesions.  No gingival swelling or erythema. Eyes:     Extraocular Movements: Extraocular movements intact.     Pupils: Pupils are equal, round, and reactive to light.  Cardiovascular:     Rate and Rhythm: Normal rate and regular rhythm.     Chest Wall: PMI is not displaced.     Heart sounds: No murmur heard.  No friction rub. No gallop.   Pulmonary:     Effort: Pulmonary effort is normal.     Breath sounds: Normal breath sounds.  Chest:     Chest wall: Tenderness present.     Comments: Tender at sterno-costal joint on L around 4th-5th ribs, reproduces described chest pain Abdominal:     General: Bowel sounds are normal.     Palpations: Abdomen is soft.     Tenderness: There is no abdominal tenderness.  Musculoskeletal:     Cervical back: Normal range of motion and neck supple.     Right lower leg: No edema.     Left lower leg: No edema.     Comments: Left shoulder:  No swelling, ecchymoses.  No gross deformity. No TTP. FROM, however with pain on full extension Negative Hawkins and obriens,  + neers  Negative Yergasons. Strength 5/5 with empty can and resisted internal/external rotation. Negative apprehension. NV intact distally.  Lymphadenopathy:      Cervical: No cervical adenopathy.  Skin:    General: Skin is warm and dry.     Findings: No rash.  Neurological:     General: No focal deficit present.     Mental Status: She is alert and oriented to person, place, and time.     Comments: CN II-XII intact.  Sensation to light touch intact throughout.  Follows commands appropriately.     ED Results / Procedures / Treatments   Labs (all labs ordered are listed, but only abnormal results are displayed) Labs Reviewed  COMPREHENSIVE METABOLIC  PANEL - Abnormal; Notable for the following components:      Result Value   Total Protein 8.2 (*)    AST 52 (*)    ALT 45 (*)    All other components within normal limits  CBC WITH DIFFERENTIAL/PLATELET - Abnormal; Notable for the following components:   WBC 12.2 (*)    Neutro Abs 9.5 (*)    All other components within normal limits  PREGNANCY, URINE  D-DIMER, QUANTITATIVE (NOT AT [facility])  TROPONIN I (HIGH SENSITIVITY)  TROPONIN I (HIGH SENSITIVITY)    EKG EKG Interpretation  Date/Time:  Thursday June 02 2020 16:17:49 EDT Ventricular Rate:  85 PR Interval:    QRS Duration: 84 QT Interval:  360 QTC Calculation: 428 R Axis:   70 Text Interpretation: Sinus rhythm RSR' in V1 or V2, probably normal variant No significant change since last tracing Confirmed by Isla Pence [id number]) on 06/02/2020 4:20:45 PM   Radiology DG Chest Portable 1 View  Result Date: 06/02/2020 CLINICAL DATA:  Chest pain EXAM: PORTABLE CHEST 1 VIEW COMPARISON:  March 10, 2020 FINDINGS: Lungs are clear. Heart size and pulmonary vascularity are normal. No adenopathy. No pneumothorax. No bone lesions. IMPRESSION: Lungs clear.  Cardiac silhouette normal. Electronically Signed   By: Lowella Grip III M.D.   On: 06/02/2020 18:00    Procedures Procedures (including critical care time)  Medications Ordered in ED Medications  alum & mag hydroxide-simeth (MAALOX/MYLANTA) 200-200-20 MG/5ML suspension 30 mL (30 mLs Oral  Given 06/02/20 1715)    And  lidocaine (XYLOCAINE) 2 % viscous mouth solution 15 mL (15 mLs Oral Given 06/02/20 1714)    ED Course  I have reviewed the triage vital signs and the nursing notes.  Pertinent labs & imaging results that were available during my care of the patient were reviewed by me and considered in my medical decision making (see chart for details).  Clinical Course as of Jun 02 2340  Thu Jun 02, 2020  1844 D-dimer, quantitative (not at [facility]) [SB]    Clinical Course User Index [SB] Patriciaann Clan, DO   MDM Rules/Calculators/A&P                          34 year old female with medical history significant for GERD presenting for a constellation of concerns including chest pain, bilateral jaw pain with temporal headache, and sour taste in her mouth for the past 2 days and left shoulder pain for the past 2 months.  Largely suspect that her chest pain with associated sour taste is secondary to uncontrolled reflux, also likely musculoskeletal component given reproducible on exam. Provided GI cocktail with improvement of chest pain and sour taste, further supporting the above. EKG sinus rhythm and similar to previous without significant Q or T wave changes.  Troponin and D-dimer negative, the above findings both suggestive against ACS or PE.  CXR clear without evidence of infiltrate. Incidental leukocytosis of 12.2, doubt bacterial infection however could have viral component to chest pain as well.  Do suspect her excessive spit up with pink-tinged saliva is also in the setting of her uncontrolled GERD with esophageal irritation.  Recommended increasing pantoprazole to 40 mg daily with Pepcid PRN and avoiding triggering foods.   Due to her asymmetrical jaw opening with clicking, believe that her jaw pains with bilateral headache are likely due to TMJ dysfunction.  Provided with TMJ exercises to improve alignment.   Lastly, reassuringly her shoulder exam was unremarkable  with the  exception of reproduced pain with Neer's test, possibly impingement, RTC tendonitis, vs bursitis.  Recommended Tylenol, Voltaren gel, and ice/heat in the region.  Could consider shoulder injection in the future vs PT.  Follow-up with PCP in the next several days and gastroenterologist in the near future.  Return precautions discussed, informed patient of mild elevation in transaminases and to have these repeated with her primary care provider (isolated finding, not concerning with current symptomatology).     Final Clinical Impression(s) / ED Diagnoses Final diagnoses:  Atypical chest pain  TMJ dysfunction  Chronic left shoulder pain  Gastroesophageal reflux disease, unspecified whether esophagitis present    Rx / DC Orders ED Discharge Orders         Ordered    famotidine (PEPCID) 20 MG tablet  Daily PRN     Discontinue  Reprint     06/02/20 1855           Allayne Stack, DO 06/02/20 2347    Jacalyn Lefevre, MD 06/06/20 1506

## 2020-06-02 NOTE — ED Notes (Signed)
ED Provider at bedside. 

## 2020-07-02 IMAGING — CT CT HEAD W/O CM
3 series · 14 of 47 positions shown, 16 images · non-contrast
Comparison: 08/20/2013

CLINICAL DATA: Headache

EXAM:
CT HEAD WITHOUT CONTRAST
TECHNIQUE: Contiguous axial images were obtained from the base of the skull
through the vertex without intravenous contrast.

[Series 2: head wo · axial · 0.40mm/px · z∈[+994,+1119]mm · 8 of 31 slices shown, 10 images]
[im 3/31  brain]
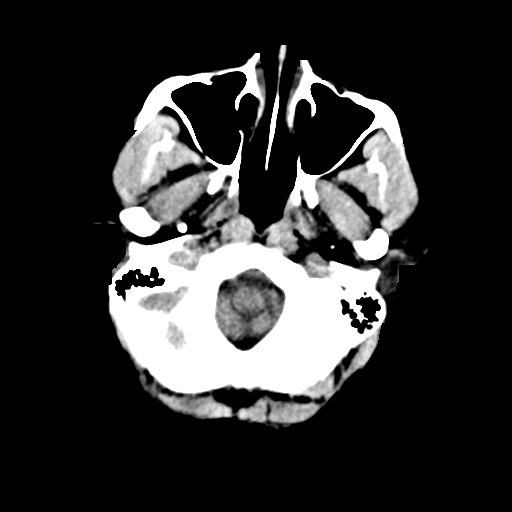
[im 3/31  bone]
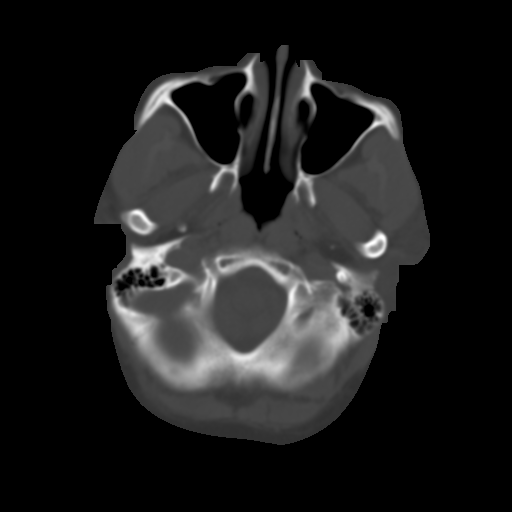
[im 7/31  brain]
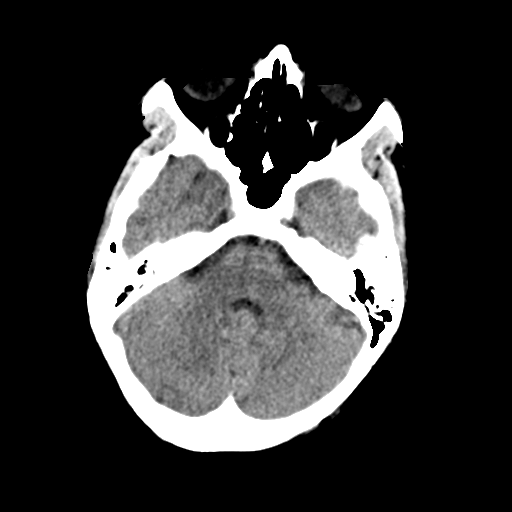
[im 10/31  brain]
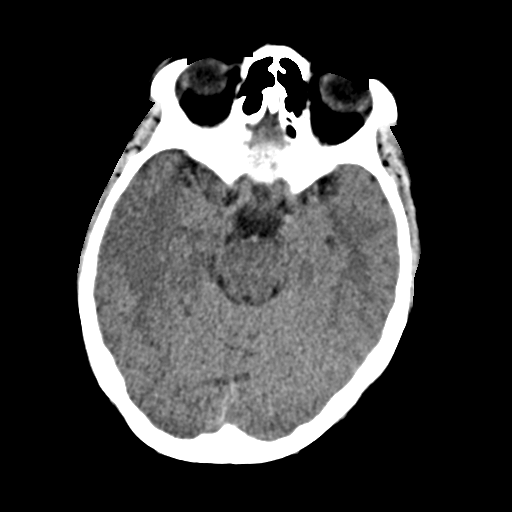
[im 14/31  brain]
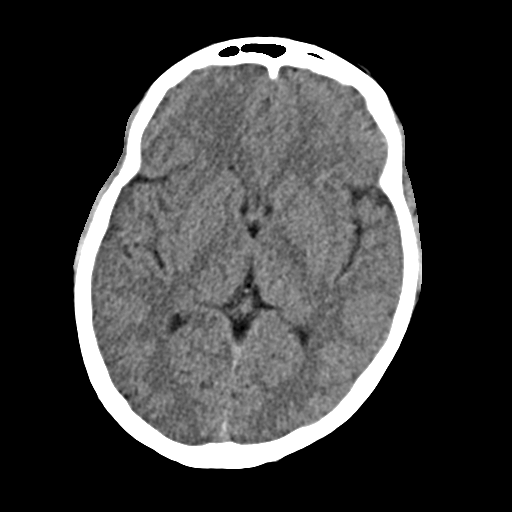
[im 17/31  brain]
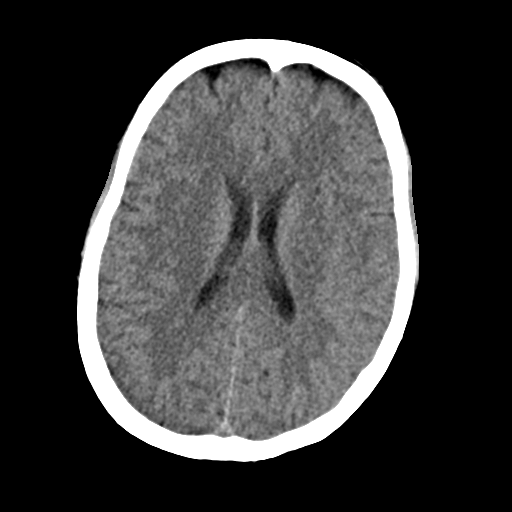
[im 17/31  bone]
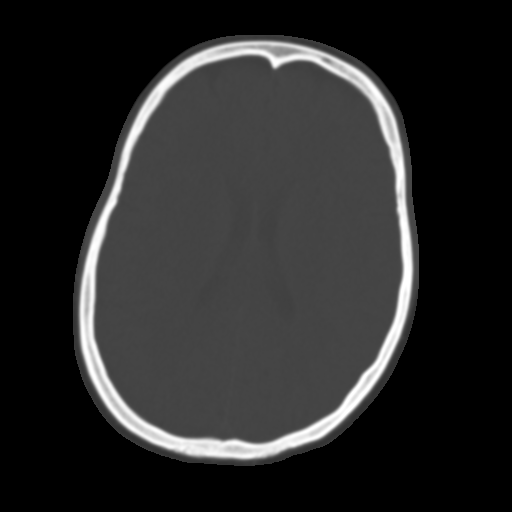
[im 21/31  brain]
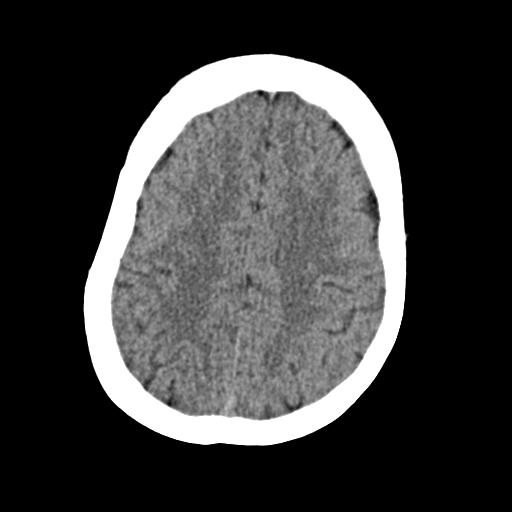
[im 24/31  brain]
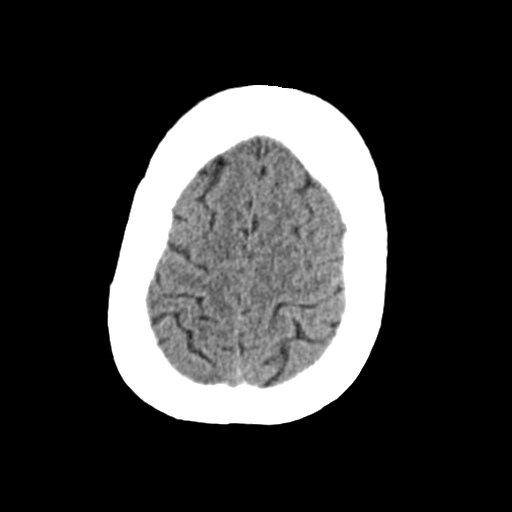
[im 28/31  brain]
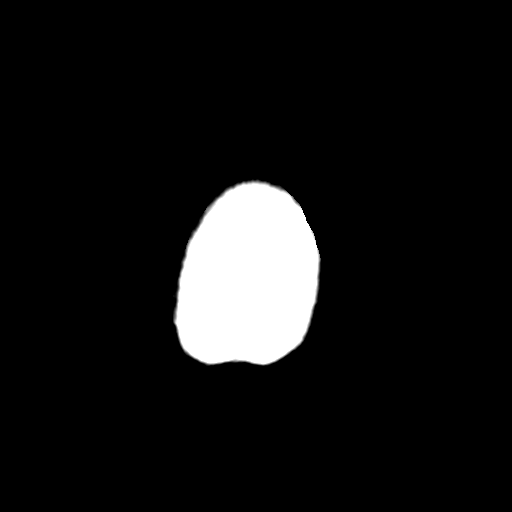

[Series 4: coronal soft · coronal · 0.31mm/px · 3 of 62 slices shown]
[im 21/62  brain]
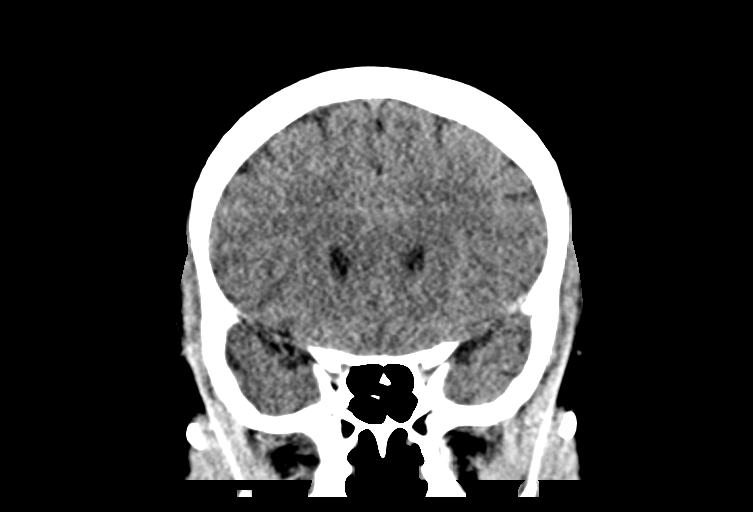
[im 28/62  brain]
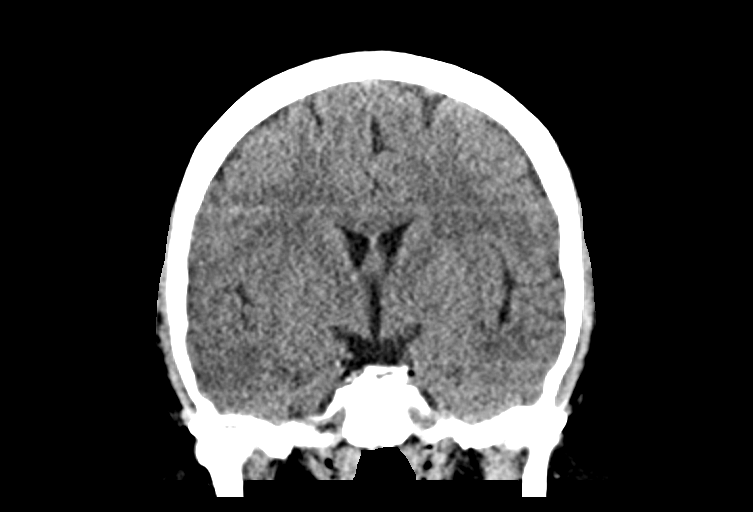
[im 34/62  brain]
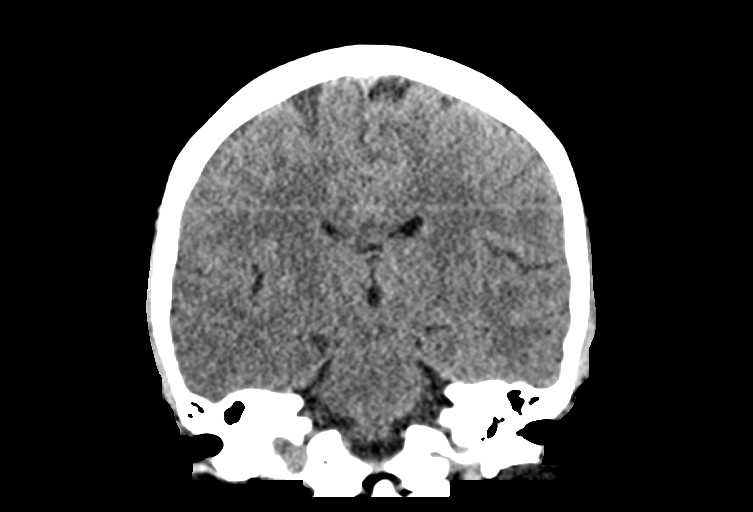

[Series 5: sag soft · sagittal · 0.30mm/px · 3 of 50 slices shown]
[im 17/50  brain]
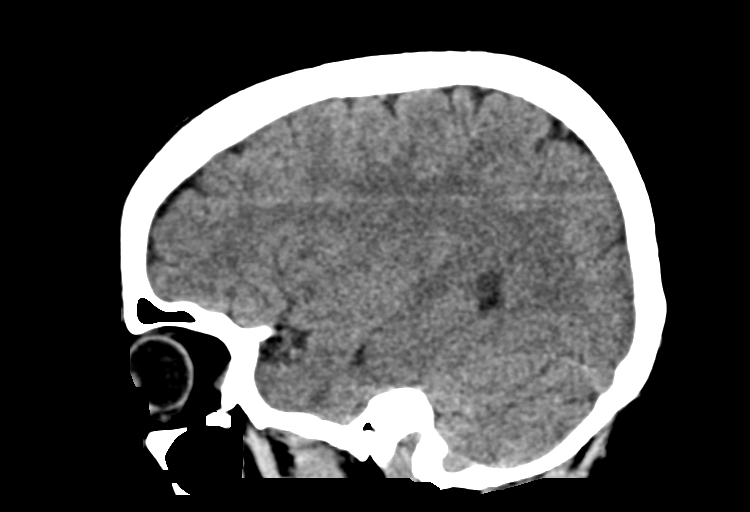
[im 25/50  brain]
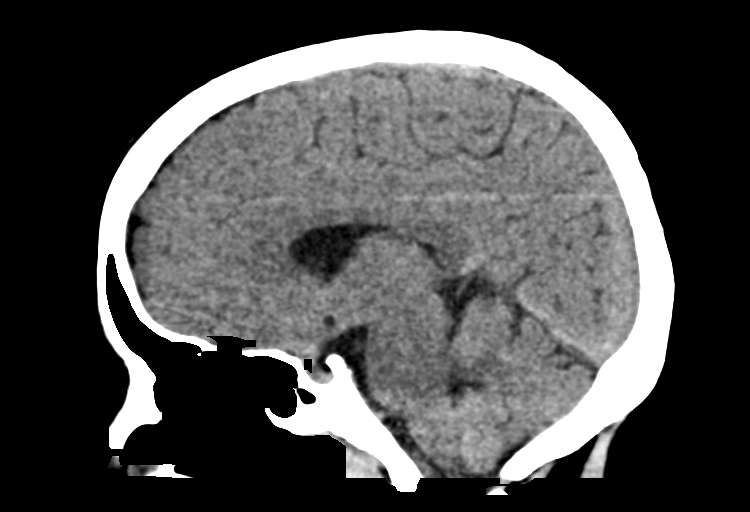
[im 33/50  brain]
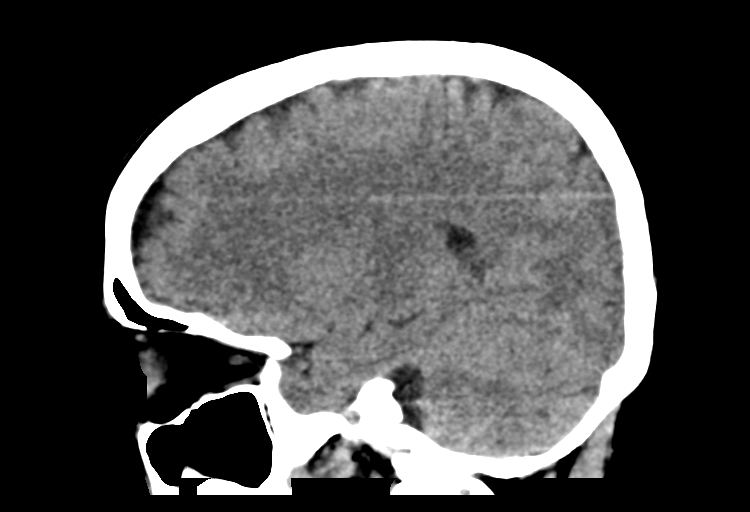

[14 of 47 positions shown; findings below may reference images not displayed]

FINDINGS: Brain: No acute intracranial abnormality. Specifically, no
hemorrhage, hydrocephalus, mass lesion, acute infarction, or
significant intracranial injury.

Vascular: No hyperdense vessel or unexpected calcification.

Skull: No acute calvarial abnormality.

Sinuses/Orbits: Visualized paranasal sinuses and mastoids clear.
Orbital soft tissues unremarkable.

Other: None
IMPRESSION: Normal study.
